# Patient Record
Sex: Female | Born: 1985 | Race: Black or African American | Hispanic: No | Marital: Single | Smoking: Current every day smoker
Health system: Southern US, Community
[De-identification: ages and names within clinical notes are randomized; demographics above are authoritative.]

## PROBLEM LIST (undated history)

## (undated) ENCOUNTER — Inpatient Hospital Stay (HOSPITAL_COMMUNITY): Payer: Self-pay

## (undated) DIAGNOSIS — R87619 Unspecified abnormal cytological findings in specimens from cervix uteri: Secondary | ICD-10-CM

## (undated) DIAGNOSIS — F329 Major depressive disorder, single episode, unspecified: Secondary | ICD-10-CM

## (undated) DIAGNOSIS — N390 Urinary tract infection, site not specified: Secondary | ICD-10-CM

## (undated) DIAGNOSIS — F32A Depression, unspecified: Secondary | ICD-10-CM

## (undated) DIAGNOSIS — F909 Attention-deficit hyperactivity disorder, unspecified type: Secondary | ICD-10-CM

## (undated) DIAGNOSIS — J45909 Unspecified asthma, uncomplicated: Secondary | ICD-10-CM

## (undated) DIAGNOSIS — B009 Herpesviral infection, unspecified: Secondary | ICD-10-CM

## (undated) DIAGNOSIS — F319 Bipolar disorder, unspecified: Secondary | ICD-10-CM

## (undated) DIAGNOSIS — F419 Anxiety disorder, unspecified: Secondary | ICD-10-CM

## (undated) DIAGNOSIS — R87629 Unspecified abnormal cytological findings in specimens from vagina: Secondary | ICD-10-CM

## (undated) DIAGNOSIS — IMO0002 Reserved for concepts with insufficient information to code with codable children: Secondary | ICD-10-CM

## (undated) HISTORY — PX: COLPOSCOPY: SHX161

## (undated) HISTORY — DX: Unspecified abnormal cytological findings in specimens from vagina: R87.629

## (undated) HISTORY — PX: INDUCED ABORTION: SHX677

---

## 2008-05-07 ENCOUNTER — Emergency Department (HOSPITAL_COMMUNITY): Admission: EM | Admit: 2008-05-07 | Discharge: 2008-05-07 | Payer: Self-pay | Admitting: Emergency Medicine

## 2008-12-31 ENCOUNTER — Inpatient Hospital Stay (HOSPITAL_COMMUNITY): Admission: AD | Admit: 2008-12-31 | Discharge: 2008-12-31 | Payer: Self-pay | Admitting: Obstetrics & Gynecology

## 2009-01-10 ENCOUNTER — Encounter: Payer: Self-pay | Admitting: Advanced Practice Midwife

## 2009-01-10 ENCOUNTER — Ambulatory Visit: Payer: Self-pay | Admitting: Obstetrics & Gynecology

## 2009-01-10 ENCOUNTER — Encounter: Payer: Self-pay | Admitting: Obstetrics & Gynecology

## 2009-01-10 LAB — CONVERTED CEMR LAB
Antibody Screen: NEGATIVE
Hgb F Quant: 0.2 % (ref 0.0–2.0)
Hgb S Quant: 0 % (ref 0.0–0.0)
Rubella: 98.4 intl units/mL — ABNORMAL HIGH

## 2009-01-24 ENCOUNTER — Ambulatory Visit: Payer: Self-pay | Admitting: Obstetrics & Gynecology

## 2009-01-31 ENCOUNTER — Ambulatory Visit: Payer: Self-pay | Admitting: Obstetrics & Gynecology

## 2009-01-31 ENCOUNTER — Encounter: Payer: Self-pay | Admitting: Obstetrics and Gynecology

## 2009-02-07 ENCOUNTER — Ambulatory Visit: Payer: Self-pay | Admitting: Obstetrics & Gynecology

## 2009-02-10 ENCOUNTER — Inpatient Hospital Stay (HOSPITAL_COMMUNITY): Admission: AD | Admit: 2009-02-10 | Discharge: 2009-02-10 | Payer: Self-pay | Admitting: Obstetrics & Gynecology

## 2009-02-14 ENCOUNTER — Ambulatory Visit: Payer: Self-pay | Admitting: Obstetrics & Gynecology

## 2009-02-15 ENCOUNTER — Ambulatory Visit: Payer: Self-pay | Admitting: Advanced Practice Midwife

## 2009-02-15 ENCOUNTER — Inpatient Hospital Stay (HOSPITAL_COMMUNITY): Admission: AD | Admit: 2009-02-15 | Discharge: 2009-02-18 | Payer: Self-pay | Admitting: Obstetrics & Gynecology

## 2009-02-15 ENCOUNTER — Encounter: Payer: Self-pay | Admitting: Obstetrics and Gynecology

## 2009-05-25 ENCOUNTER — Emergency Department (HOSPITAL_COMMUNITY): Admission: EM | Admit: 2009-05-25 | Discharge: 2009-05-25 | Payer: Self-pay | Admitting: Emergency Medicine

## 2009-06-05 ENCOUNTER — Emergency Department (HOSPITAL_COMMUNITY): Admission: EM | Admit: 2009-06-05 | Discharge: 2009-06-05 | Payer: Self-pay | Admitting: Emergency Medicine

## 2009-08-24 ENCOUNTER — Emergency Department (HOSPITAL_COMMUNITY): Admission: EM | Admit: 2009-08-24 | Discharge: 2009-08-24 | Payer: Self-pay | Admitting: Emergency Medicine

## 2009-12-06 ENCOUNTER — Emergency Department (HOSPITAL_COMMUNITY): Admission: EM | Admit: 2009-12-06 | Discharge: 2009-12-06 | Payer: Self-pay | Admitting: Emergency Medicine

## 2010-03-23 ENCOUNTER — Encounter: Payer: Self-pay | Admitting: Family Medicine

## 2010-03-23 ENCOUNTER — Inpatient Hospital Stay (HOSPITAL_COMMUNITY): Admission: AD | Admit: 2010-03-23 | Discharge: 2010-03-23 | Payer: Self-pay | Admitting: Family Medicine

## 2010-03-23 ENCOUNTER — Ambulatory Visit: Payer: Self-pay | Admitting: Advanced Practice Midwife

## 2010-06-10 ENCOUNTER — Inpatient Hospital Stay (HOSPITAL_COMMUNITY)
Admission: AD | Admit: 2010-06-10 | Discharge: 2010-06-10 | Payer: Self-pay | Source: Home / Self Care | Admitting: Obstetrics & Gynecology

## 2010-06-18 ENCOUNTER — Encounter: Payer: Self-pay | Admitting: Obstetrics and Gynecology

## 2010-06-18 ENCOUNTER — Ambulatory Visit: Payer: Self-pay | Admitting: Family Medicine

## 2010-06-19 ENCOUNTER — Encounter: Payer: Self-pay | Admitting: Obstetrics and Gynecology

## 2010-06-26 ENCOUNTER — Ambulatory Visit: Payer: Self-pay | Admitting: Obstetrics & Gynecology

## 2010-07-03 ENCOUNTER — Ambulatory Visit: Payer: Self-pay | Admitting: Obstetrics and Gynecology

## 2010-07-11 ENCOUNTER — Ambulatory Visit: Payer: Self-pay | Admitting: Obstetrics & Gynecology

## 2010-07-12 ENCOUNTER — Inpatient Hospital Stay (HOSPITAL_COMMUNITY)
Admission: AD | Admit: 2010-07-12 | Discharge: 2010-07-14 | Payer: Self-pay | Source: Home / Self Care | Attending: Obstetrics & Gynecology | Admitting: Obstetrics & Gynecology

## 2010-07-15 ENCOUNTER — Ambulatory Visit: Admit: 2010-07-15 | Payer: Self-pay | Admitting: Obstetrics and Gynecology

## 2010-07-17 ENCOUNTER — Ambulatory Visit: Admit: 2010-07-17 | Payer: Self-pay | Admitting: Obstetrics & Gynecology

## 2010-09-23 LAB — WET PREP, GENITAL
Trich, Wet Prep: NONE SEEN
Yeast Wet Prep HPF POC: NONE SEEN

## 2010-09-23 LAB — POCT URINALYSIS DIPSTICK
Bilirubin Urine: NEGATIVE
Glucose, UA: NEGATIVE mg/dL
Glucose, UA: NEGATIVE mg/dL
Ketones, ur: NEGATIVE mg/dL
Ketones, ur: NEGATIVE mg/dL
Nitrite: NEGATIVE
Protein, ur: NEGATIVE mg/dL
Specific Gravity, Urine: 1.015 (ref 1.005–1.030)
pH: 7 (ref 5.0–8.0)

## 2010-09-23 LAB — RAPID URINE DRUG SCREEN, HOSP PERFORMED
Amphetamines: NOT DETECTED
Barbiturates: NOT DETECTED
Tetrahydrocannabinol: POSITIVE — AB

## 2010-09-23 LAB — CBC
HCT: 31.1 % — ABNORMAL LOW (ref 36.0–46.0)
HCT: 32.8 % — ABNORMAL LOW (ref 36.0–46.0)
Hemoglobin: 10.7 g/dL — ABNORMAL LOW (ref 12.0–15.0)
MCHC: 34.5 g/dL (ref 30.0–36.0)
MCV: 85.2 fL (ref 78.0–100.0)
Platelets: 330 10*3/uL (ref 150–400)
RBC: 3.69 MIL/uL — ABNORMAL LOW (ref 3.87–5.11)
RDW: 13.4 % (ref 11.5–15.5)

## 2010-09-24 LAB — RAPID URINE DRUG SCREEN, HOSP PERFORMED
Amphetamines: NOT DETECTED
Barbiturates: NOT DETECTED
Benzodiazepines: NOT DETECTED
Cocaine: NOT DETECTED
Opiates: NOT DETECTED
Tetrahydrocannabinol: POSITIVE — AB

## 2010-09-24 LAB — CBC
HCT: 31.3 % — ABNORMAL LOW (ref 36.0–46.0)
Hemoglobin: 10.7 g/dL — ABNORMAL LOW (ref 12.0–15.0)
MCH: 31.1 pg (ref 26.0–34.0)
MCHC: 34.3 g/dL (ref 30.0–36.0)
MCV: 90.7 fL (ref 78.0–100.0)
Platelets: 298 10*3/uL (ref 150–400)
RBC: 3.45 MIL/uL — ABNORMAL LOW (ref 3.87–5.11)
RDW: 13.3 % (ref 11.5–15.5)
WBC: 9.6 10*3/uL (ref 4.0–10.5)

## 2010-09-24 LAB — DIFFERENTIAL
Basophils Absolute: 0.1 10*3/uL (ref 0.0–0.1)
Basophils Relative: 1 % (ref 0–1)
Eosinophils Absolute: 0.1 10*3/uL (ref 0.0–0.7)
Monocytes Relative: 6 % (ref 3–12)
Neutro Abs: 6 10*3/uL (ref 1.7–7.7)
Neutrophils Relative %: 63 % (ref 43–77)

## 2010-09-24 LAB — STREP B DNA PROBE: Strep Group B Ag: POSITIVE

## 2010-09-24 LAB — GC/CHLAMYDIA PROBE AMP, GENITAL
Chlamydia, DNA Probe: NEGATIVE
GC Probe Amp, Genital: NEGATIVE

## 2010-09-24 LAB — HEPATITIS B SURFACE ANTIGEN: Hepatitis B Surface Ag: NEGATIVE

## 2010-09-24 LAB — URINALYSIS, ROUTINE W REFLEX MICROSCOPIC
Bilirubin Urine: NEGATIVE
Hgb urine dipstick: NEGATIVE
Ketones, ur: NEGATIVE mg/dL
Protein, ur: NEGATIVE mg/dL
Urobilinogen, UA: 1 mg/dL (ref 0.0–1.0)

## 2010-09-24 LAB — HCG, QUANTITATIVE, PREGNANCY: hCG, Beta Chain, Quant, S: 30553 m[IU]/mL — ABNORMAL HIGH (ref <5)

## 2010-09-24 LAB — WET PREP, GENITAL: Trich, Wet Prep: NONE SEEN

## 2010-09-24 LAB — TYPE AND SCREEN: ABO/RH(D): A POS

## 2010-09-24 LAB — HIV ANTIBODY (ROUTINE TESTING W REFLEX): HIV: NONREACTIVE

## 2010-09-26 LAB — URINALYSIS, ROUTINE W REFLEX MICROSCOPIC
Glucose, UA: NEGATIVE mg/dL
Hgb urine dipstick: NEGATIVE
Protein, ur: NEGATIVE mg/dL
Specific Gravity, Urine: <1.005 — ABNORMAL LOW (ref 1.005–1.030)
pH: 6.5 (ref 5.0–8.0)

## 2010-09-30 LAB — URINALYSIS, ROUTINE W REFLEX MICROSCOPIC
Bilirubin Urine: NEGATIVE
Hgb urine dipstick: NEGATIVE
Specific Gravity, Urine: 1.03 (ref 1.005–1.030)
Urobilinogen, UA: 1 mg/dL (ref 0.0–1.0)

## 2010-09-30 LAB — WET PREP, GENITAL
Clue Cells Wet Prep HPF POC: NONE SEEN
Trich, Wet Prep: NONE SEEN
Yeast Wet Prep HPF POC: NONE SEEN

## 2010-09-30 LAB — URINE CULTURE: Culture: NO GROWTH

## 2010-09-30 LAB — URINE MICROSCOPIC-ADD ON

## 2010-10-16 LAB — URINALYSIS, ROUTINE W REFLEX MICROSCOPIC
Glucose, UA: NEGATIVE mg/dL
Hgb urine dipstick: NEGATIVE
pH: 6 (ref 5.0–8.0)

## 2010-10-16 LAB — URINE MICROSCOPIC-ADD ON

## 2010-10-16 LAB — GC/CHLAMYDIA PROBE AMP, GENITAL: Chlamydia, DNA Probe: POSITIVE — AB

## 2010-10-19 LAB — POCT URINALYSIS DIP (DEVICE)
Bilirubin Urine: NEGATIVE
Glucose, UA: NEGATIVE mg/dL
Ketones, ur: NEGATIVE mg/dL
Specific Gravity, Urine: 1.02 (ref 1.005–1.030)
Urobilinogen, UA: 0.2 mg/dL (ref 0.0–1.0)

## 2010-10-19 LAB — RPR: RPR Ser Ql: NONREACTIVE

## 2010-10-19 LAB — CBC
HCT: 32.8 % — ABNORMAL LOW (ref 36.0–46.0)
Hemoglobin: 11.3 g/dL — ABNORMAL LOW (ref 12.0–15.0)
MCV: 92.3 fL (ref 78.0–100.0)
RBC: 3.55 MIL/uL — ABNORMAL LOW (ref 3.87–5.11)
WBC: 12 10*3/uL — ABNORMAL HIGH (ref 4.0–10.5)

## 2010-10-20 LAB — POCT URINALYSIS DIP (DEVICE)
Bilirubin Urine: NEGATIVE
Glucose, UA: NEGATIVE mg/dL
Ketones, ur: NEGATIVE mg/dL
Ketones, ur: NEGATIVE mg/dL
Nitrite: NEGATIVE
Nitrite: NEGATIVE
Protein, ur: NEGATIVE mg/dL
Protein, ur: NEGATIVE mg/dL
Specific Gravity, Urine: 1.01 (ref 1.005–1.030)
Specific Gravity, Urine: 1.015 (ref 1.005–1.030)
Urobilinogen, UA: 0.2 mg/dL (ref 0.0–1.0)
Urobilinogen, UA: 0.2 mg/dL (ref 0.0–1.0)
pH: 6.5 (ref 5.0–8.0)
pH: 7 (ref 5.0–8.0)

## 2010-10-21 LAB — RAPID URINE DRUG SCREEN, HOSP PERFORMED: Tetrahydrocannabinol: POSITIVE — AB

## 2010-10-21 LAB — COMPREHENSIVE METABOLIC PANEL
ALT: 18 U/L (ref 0–35)
AST: 18 U/L (ref 0–37)
Albumin: 3 g/dL — ABNORMAL LOW (ref 3.5–5.2)
Alkaline Phosphatase: 73 U/L (ref 39–117)
CO2: 23 mEq/L (ref 19–32)
Chloride: 107 mEq/L (ref 96–112)
Creatinine, Ser: 0.49 mg/dL (ref 0.4–1.2)
GFR calc Af Amer: >60 mL/min (ref >60)
GFR calc non Af Amer: >60 mL/min (ref >60)
Potassium: 4 mEq/L (ref 3.5–5.1)
Sodium: 136 mEq/L (ref 135–145)
Total Bilirubin: 0.4 mg/dL (ref 0.3–1.2)

## 2010-10-21 LAB — URINALYSIS, ROUTINE W REFLEX MICROSCOPIC
Bilirubin Urine: NEGATIVE
Nitrite: NEGATIVE
Specific Gravity, Urine: 1.01 (ref 1.005–1.030)
pH: 7.5 (ref 5.0–8.0)

## 2010-10-21 LAB — GC/CHLAMYDIA PROBE AMP, URINE
Chlamydia, Swab/Urine, PCR: NEGATIVE
GC Probe Amp, Urine: NEGATIVE

## 2010-10-21 LAB — POCT URINALYSIS DIP (DEVICE)
Bilirubin Urine: NEGATIVE
Glucose, UA: NEGATIVE mg/dL
Nitrite: NEGATIVE
Urobilinogen, UA: 0.2 mg/dL (ref 0.0–1.0)

## 2010-10-21 LAB — CBC
Platelets: 254 10*3/uL (ref 150–400)
RBC: 3.57 MIL/uL — ABNORMAL LOW (ref 3.87–5.11)
WBC: 11.2 10*3/uL — ABNORMAL HIGH (ref 4.0–10.5)

## 2010-10-21 LAB — URINE MICROSCOPIC-ADD ON

## 2010-10-21 LAB — ABO/RH: ABO/RH(D): A POS

## 2011-01-07 ENCOUNTER — Emergency Department (HOSPITAL_COMMUNITY)
Admission: EM | Admit: 2011-01-07 | Discharge: 2011-01-07 | Discharge status: Against Medical Advice | Payer: Medicare Other | Attending: Emergency Medicine | Admitting: Emergency Medicine

## 2011-01-07 DIAGNOSIS — Z0389 Encounter for observation for other suspected diseases and conditions ruled out: Secondary | ICD-10-CM | POA: Insufficient documentation

## 2011-02-05 ENCOUNTER — Emergency Department (HOSPITAL_COMMUNITY)
Admission: EM | Admit: 2011-02-05 | Discharge: 2011-02-05 | Disposition: A | Discharge status: Home / Self Care | Payer: Medicare Other | Attending: Emergency Medicine | Admitting: Emergency Medicine

## 2011-02-05 DIAGNOSIS — N760 Acute vaginitis: Secondary | ICD-10-CM | POA: Insufficient documentation

## 2011-02-05 LAB — URINALYSIS, ROUTINE W REFLEX MICROSCOPIC
Bilirubin Urine: NEGATIVE
Glucose, UA: NEGATIVE mg/dL
Leukocytes, UA: NEGATIVE
Specific Gravity, Urine: 1.034 — ABNORMAL HIGH (ref 1.005–1.030)
pH: 6 (ref 5.0–8.0)

## 2011-02-05 LAB — POCT PREGNANCY, URINE: Preg Test, Ur: NEGATIVE

## 2011-02-05 LAB — WET PREP, GENITAL: Yeast Wet Prep HPF POC: NONE SEEN

## 2011-02-06 LAB — GC/CHLAMYDIA PROBE AMP, GENITAL: Chlamydia, DNA Probe: NEGATIVE

## 2011-05-26 ENCOUNTER — Emergency Department (HOSPITAL_COMMUNITY)
Admission: EM | Admit: 2011-05-26 | Discharge: 2011-05-27 | Disposition: A | Discharge status: Home / Self Care | Payer: Medicare Other | Attending: Emergency Medicine | Admitting: Emergency Medicine

## 2011-05-26 ENCOUNTER — Encounter: Payer: Self-pay | Admitting: *Deleted

## 2011-05-26 DIAGNOSIS — R109 Unspecified abdominal pain: Secondary | ICD-10-CM | POA: Insufficient documentation

## 2011-05-26 DIAGNOSIS — B009 Herpesviral infection, unspecified: Secondary | ICD-10-CM | POA: Insufficient documentation

## 2011-05-26 DIAGNOSIS — B001 Herpesviral vesicular dermatitis: Secondary | ICD-10-CM

## 2011-05-26 DIAGNOSIS — N898 Other specified noninflammatory disorders of vagina: Secondary | ICD-10-CM | POA: Insufficient documentation

## 2011-05-26 NOTE — ED Notes (Signed)
Pt in c/o vaginal discharge and pelvic pain x5 days

## 2011-05-27 ENCOUNTER — Emergency Department (HOSPITAL_COMMUNITY): Payer: Medicare Other

## 2011-05-27 LAB — CBC
HCT: 31.9 % — ABNORMAL LOW (ref 36.0–46.0)
MCH: 29.2 pg (ref 26.0–34.0)
MCV: 85.5 fL (ref 78.0–100.0)
Platelets: 324 10*3/uL (ref 150–400)
RBC: 3.73 MIL/uL — ABNORMAL LOW (ref 3.87–5.11)
WBC: 8.2 10*3/uL (ref 4.0–10.5)

## 2011-05-27 LAB — DIFFERENTIAL
Eosinophils Absolute: 0.1 10*3/uL (ref 0.0–0.7)
Eosinophils Relative: 2 % (ref 0–5)
Lymphocytes Relative: 54 % — ABNORMAL HIGH (ref 12–46)
Lymphs Abs: 4.4 10*3/uL — ABNORMAL HIGH (ref 0.7–4.0)
Monocytes Absolute: 0.4 10*3/uL (ref 0.1–1.0)
Monocytes Relative: 5 % (ref 3–12)

## 2011-05-27 LAB — WET PREP, GENITAL

## 2011-05-27 MED ORDER — CEFTRIAXONE SODIUM 250 MG IJ SOLR
250.0000 mg | Freq: Once | INTRAMUSCULAR | Status: AC
  Administered 2011-05-27: 250 mg via INTRAMUSCULAR
  Filled 2011-05-27: qty 250

## 2011-05-27 MED ORDER — ACYCLOVIR 5 % EX OINT: TOPICAL_OINTMENT | CUTANEOUS | Status: DC

## 2011-05-27 MED ORDER — AZITHROMYCIN 250 MG PO TABS
1000.0000 mg | ORAL_TABLET | Freq: Once | ORAL | Status: AC
  Administered 2011-05-27: 1000 mg via ORAL
  Filled 2011-05-27: qty 4

## 2011-05-27 NOTE — ED Provider Notes (Signed)
History     CSN: [id number] Arrival date & time: 05/26/2011  9:51 PM   First MD Initiated Contact with Patient 05/27/11 [phone]      Chief Complaint  Patient presents with  . Vaginal Discharge    (Consider location/radiation/quality/duration/timing/severity/associated sxs/prior treatment) HPI Comments: Ms. Motz reports having a therapeutic abortion November 3, started on birth control pills at that time and self-reports that she has missed 3-4 of these since starting, bled for 3-4 days,Developed dischargeabout 5 days ago.  Diffuse low abdominal pain, yellow vaginal discharge with an odor reports having unprotected intercourse  Patient is a 25 y.o. female presenting with vaginal discharge. The history is provided by the patient.  Vaginal Discharge This is a new problem. The current episode started in the past 7 days. The problem occurs constantly. The problem has been gradually worsening. Associated symptoms include abdominal pain. Pertinent negatives include no fever or nausea. Associated symptoms comments: Vaginal discharge.    History reviewed. No pertinent past medical history.  History reviewed. No pertinent past surgical history.  History reviewed. No pertinent family history.  History  Substance Use Topics  . Smoking status: Current Everyday Smoker  . Smokeless tobacco: Not on file  . Alcohol Use: No    OB History    Grav Para Term Preterm Abortions TAB SAB Ect Mult Living                  Review of Systems  Constitutional: Negative for fever.  HENT: Negative.   Eyes: Negative.   Respiratory: Negative.   Cardiovascular: Negative.   Gastrointestinal: Positive for abdominal pain. Negative for nausea.  Genitourinary: Positive for vaginal discharge, vaginal pain and pelvic pain. Negative for dysuria, frequency, vaginal bleeding, difficulty urinating and dyspareunia.  Musculoskeletal: Negative.   Neurological: Negative.   Hematological: Negative.     Psychiatric/Behavioral: Negative.     Allergies  Review of patient's allergies indicates no known allergies.  Home Medications   Current Outpatient Rx  Name Route Sig Dispense Refill  . METHYLPHENIDATE HCL 36 MG PO TBCR Oral Take 36 mg by mouth every morning.        BP 120/63  Pulse 67  Temp(Src) 99 F (37.2 C) (Oral)  Resp 20  SpO2 100%  LMP 03/30/2011  Physical Exam  Constitutional: She is oriented to person, place, and time. She appears well-developed and well-nourished.  HENT:  Head: Normocephalic.  Eyes: EOM are normal.  Neck: Neck supple.  Cardiovascular: Regular rhythm.   Pulmonary/Chest: Breath sounds normal.  Abdominal: Bowel sounds are normal. She exhibits no distension. There is tenderness. There is rebound.  Genitourinary: Uterus normal. Right adnexum displays tenderness. Left adnexum displays tenderness. There is tenderness around the vagina. Vaginal discharge found.  Musculoskeletal: Normal range of motion.  Neurological: She is oriented to person, place, and time.  Skin: Skin is warm.  Psychiatric: She has a normal mood and affect.    ED Course  Procedures (including critical care time)   Labs Reviewed  POCT PREGNANCY, URINE  GC/CHLAMYDIA PROBE AMP, GENITAL  WET PREP, GENITAL  POCT PREGNANCY, URINE  CBC  DIFFERENTIAL   No results found.   No diagnosis found.    MDM  Retained products of conception         Arman Filter, NP 05/27/11 0428  Arman Filter, NP 05/27/11 [phone]

## 2011-05-27 NOTE — ED Notes (Signed)
Patient remains in ultrasound.

## 2011-05-27 NOTE — ED Provider Notes (Signed)
Medical screening examination/treatment/procedure(s) were performed by non-physician practitioner and as supervising physician I was immediately available for consultation/collaboration. Devoria Albe, MD, Armando Gang   Ward Givens, MD 05/27/11 [phone]

## 2011-05-27 NOTE — ED Notes (Signed)
Spoke with Raiford Noble in Radiology. States ultrasound tech aware of order for transvaginal US OB.

## 2011-05-27 NOTE — ED Notes (Signed)
Patient states that she is concerned with her lip breaking out. States that she has had brownish, malodorous vaginal discharge for a month. Denies dysuria. Patient states "I think I have PID." States she has a history of STDs but "not any of the ones you can't get rid of."

## 2011-05-27 NOTE — ED Notes (Signed)
Transported to ultrasound

## 2011-05-27 NOTE — ED Notes (Signed)
Returned from ultrasound.

## 2011-05-28 LAB — GC/CHLAMYDIA PROBE AMP, GENITAL
Chlamydia, DNA Probe: NEGATIVE
GC Probe Amp, Genital: NEGATIVE

## 2011-08-05 ENCOUNTER — Ambulatory Visit (INDEPENDENT_AMBULATORY_CARE_PROVIDER_SITE_OTHER): Payer: Medicare Other

## 2011-08-05 DIAGNOSIS — F909 Attention-deficit hyperactivity disorder, unspecified type: Secondary | ICD-10-CM

## 2011-08-05 DIAGNOSIS — Z309 Encounter for contraceptive management, unspecified: Secondary | ICD-10-CM

## 2011-08-05 DIAGNOSIS — F319 Bipolar disorder, unspecified: Secondary | ICD-10-CM

## 2011-08-05 DIAGNOSIS — Z01419 Encounter for gynecological examination (general) (routine) without abnormal findings: Secondary | ICD-10-CM

## 2011-08-09 ENCOUNTER — Ambulatory Visit (INDEPENDENT_AMBULATORY_CARE_PROVIDER_SITE_OTHER): Payer: Medicare Other

## 2011-08-09 DIAGNOSIS — Z719 Counseling, unspecified: Secondary | ICD-10-CM

## 2011-08-16 ENCOUNTER — Encounter (HOSPITAL_COMMUNITY): Payer: Self-pay | Admitting: *Deleted

## 2011-08-16 ENCOUNTER — Inpatient Hospital Stay (HOSPITAL_COMMUNITY)
Admission: AD | Admit: 2011-08-16 | Discharge: 2011-08-16 | Disposition: A | Discharge status: Home / Self Care | Payer: Medicare Other | Source: Ambulatory Visit | Attending: Family Medicine | Admitting: Family Medicine

## 2011-08-16 DIAGNOSIS — Z8619 Personal history of other infectious and parasitic diseases: Secondary | ICD-10-CM

## 2011-08-16 DIAGNOSIS — N760 Acute vaginitis: Secondary | ICD-10-CM | POA: Insufficient documentation

## 2011-08-16 DIAGNOSIS — N949 Unspecified condition associated with female genital organs and menstrual cycle: Secondary | ICD-10-CM | POA: Insufficient documentation

## 2011-08-16 HISTORY — DX: Major depressive disorder, single episode, unspecified: F32.9

## 2011-08-16 HISTORY — DX: Urinary tract infection, site not specified: N39.0

## 2011-08-16 HISTORY — DX: Unspecified abnormal cytological findings in specimens from cervix uteri: R87.619

## 2011-08-16 HISTORY — DX: Herpesviral infection, unspecified: B00.9

## 2011-08-16 HISTORY — DX: Reserved for concepts with insufficient information to code with codable children: IMO0002

## 2011-08-16 HISTORY — DX: Depression, unspecified: F32.A

## 2011-08-16 LAB — POCT PREGNANCY, URINE: Preg Test, Ur: NEGATIVE

## 2011-08-16 LAB — URINALYSIS, MICROSCOPIC ONLY
Bilirubin Urine: NEGATIVE
Hgb urine dipstick: NEGATIVE
Ketones, ur: NEGATIVE mg/dL
Specific Gravity, Urine: 1.025 (ref 1.005–1.030)
pH: 6 (ref 5.0–8.0)

## 2011-08-16 LAB — WET PREP, GENITAL
Trich, Wet Prep: NONE SEEN
Yeast Wet Prep HPF POC: NONE SEEN

## 2011-08-16 MED ORDER — TRIAMCINOLONE ACETONIDE 0.1 % EX OINT: TOPICAL_OINTMENT | Freq: Two times a day (BID) | CUTANEOUS | Status: DC

## 2011-08-16 MED ORDER — VALACYCLOVIR HCL 500 MG PO TABS: 500.0000 mg | ORAL_TABLET | Freq: Two times a day (BID) | ORAL | Status: DC

## 2011-08-16 NOTE — Progress Notes (Signed)
Pt presents to [facility] with chief complaints of "herpes outbreak". 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

## 2011-08-16 NOTE — ED Provider Notes (Signed)
History   Karen Lester is a 26 y.o. year old [id number] female who presents to [facility] reporting vaginal irritation and possible HSV II outbreak. She states she tested positive for it at [facility] last month during an STD check, but has never had an outbreak. She denies rash or vesicles. She states she also Dx w/ Trichomonas an Tx, but vomited about an hour after taking medication. She is also very anxious about a recent abn Pap for which she has a F/U appointment scheduled 08/19/11 w/ her Gyn in [facility].   CSN: [id number]  Arrival date & time 08/16/11  4098   None     Chief Complaint  Patient presents with  . Herpes Zoster    (Consider location/radiation/quality/duration/timing/severity/associated sxs/prior treatment) HPI  Past Medical History  Diagnosis Date  . Herpes   . Depression   . Urinary tract infection   . Abnormal Pap smear     Has appt. 2/5 to treat abnormal PAP    Past Surgical History  Procedure Date  . Induced abortion     History reviewed. No pertinent family history.  History  Substance Use Topics  . Smoking status: Current Everyday Smoker -- 0.2 packs/day  . Smokeless tobacco: Never Used  . Alcohol Use: Yes     OCcas.    OB History    Grav Para Term Preterm Abortions TAB SAB Ect Mult Living   5 4 4  1 1    4       Review of Systems: Otherwise negative  Allergies  Review of patient's allergies indicates no known allergies.  Home Medications  No current outpatient prescriptions on file.  BP 135/91  Pulse 100  Temp(Src) 98.9 F (37.2 C) (Oral)  Resp 22  Wt 86.183 kg (190 lb)  LMP 07/17/2011  Physical Exam Pt highly anxious, tearful, A&Ox4, pressured speech Pelvic: No lesions or erythema. Small amount of thin, white, odorless discharge. No CMT  ED Course  Procedures (including critical care time)  Results for orders placed during the hospital encounter of 08/16/11 (from the past 24 hour(s))  URINALYSIS, WITH MICROSCOPIC      Status: Abnormal   Collection Time   08/16/11  9:50 AM      Component Value Range   Color, Urine YELLOW  YELLOW    APPearance CLEAR  CLEAR    Specific Gravity, Urine 1.025  1.005 - 1.030    pH 6.0  5.0 - 8.0    Glucose, UA NEGATIVE  NEGATIVE (mg/dL)   Hgb urine dipstick NEGATIVE  NEGATIVE    Bilirubin Urine NEGATIVE  NEGATIVE    Ketones, ur NEGATIVE  NEGATIVE (mg/dL)   Protein, ur NEGATIVE  NEGATIVE (mg/dL)   Urobilinogen, UA 0.2  0.0 - 1.0 (mg/dL)   Nitrite NEGATIVE  NEGATIVE    Leukocytes, UA NEGATIVE  NEGATIVE    WBC, UA 0-2  <3 (WBC/hpf)   RBC / HPF 0-2  <3 (RBC/hpf)   Bacteria, UA FEW (*) RARE    Squamous Epithelial / LPF FEW (*) RARE   POCT PREGNANCY, URINE     Status: Normal   Collection Time   08/16/11  9:55 AM      Component Value Range   Preg Test, Ur NEGATIVE  NEGATIVE   WET PREP, GENITAL     Status: Abnormal   Collection Time   08/16/11 10:41 AM      Component Value Range   Yeast Wet Prep HPF POC NONE SEEN  NONE  SEEN    Trich, Wet Prep NONE SEEN  NONE SEEN    Clue Cells Wet Prep HPF POC NONE SEEN  NONE SEEN    WBC, Wet Prep HPF POC FEW (*) NONE SEEN    1. Vaginitis   2. 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 D/C home 2. Rx Valtrex  3. Lengthy discussion about HSV II transmission, outbreaks, Sx, prevention. Pt very apprehensive about taking Valtrex. Afraid it will cause outbreak. Reassured that it will not, but that it can help decrease the duration and severity of an outbreak. Pt had many questions about abn Pap. Referred her to her Gyn to discuss specific findings. Also encouraged her to F/U w/ her Gynecologist for any related issues instead of having testing done at various locations where the providers don't know what tests have been done, etc. 4. Encouraged condom use, but emphasized that they cannot prevent all transmission.  Dorathy Kinsman 08/16/2011

## 2011-08-16 NOTE — ED Notes (Signed)
Questioned patient regarding visit in November where she was treated for HSV out break and had + UPT. States she does not remember being told she had an HSV outbreak and does not remember receiving any kind of treatment. States she had had an abortion. Patient asking many questions about previous visit and informed midlevel can review and discuss with her as well as address her concerns for today.

## 2011-08-16 NOTE — ED Notes (Signed)
Patient talking on cell phone, motioning for RN to be quiet and wait.

## 2011-08-20 NOTE — ED Provider Notes (Signed)
Chart reviewed and agree with management and plan.  

## 2011-10-09 ENCOUNTER — Other Ambulatory Visit: Payer: Self-pay | Admitting: Obstetrics and Gynecology

## 2011-11-12 ENCOUNTER — Encounter (HOSPITAL_COMMUNITY): Payer: Self-pay | Admitting: Emergency Medicine

## 2011-11-12 ENCOUNTER — Emergency Department (HOSPITAL_COMMUNITY)
Admission: EM | Admit: 2011-11-12 | Discharge: 2011-11-13 | Disposition: A | Discharge status: Home / Self Care | Payer: No Typology Code available for payment source | Attending: Emergency Medicine | Admitting: Emergency Medicine

## 2011-11-12 DIAGNOSIS — M545 Low back pain, unspecified: Secondary | ICD-10-CM | POA: Insufficient documentation

## 2011-11-12 DIAGNOSIS — F329 Major depressive disorder, single episode, unspecified: Secondary | ICD-10-CM | POA: Insufficient documentation

## 2011-11-12 DIAGNOSIS — F3289 Other specified depressive episodes: Secondary | ICD-10-CM | POA: Insufficient documentation

## 2011-11-12 DIAGNOSIS — M542 Cervicalgia: Secondary | ICD-10-CM | POA: Insufficient documentation

## 2011-11-12 DIAGNOSIS — M546 Pain in thoracic spine: Secondary | ICD-10-CM | POA: Insufficient documentation

## 2011-11-12 DIAGNOSIS — M79609 Pain in unspecified limb: Secondary | ICD-10-CM | POA: Insufficient documentation

## 2011-11-12 MED ORDER — DIAZEPAM 5 MG PO TABS: 5.0000 mg | ORAL_TABLET | Freq: Two times a day (BID) | ORAL | Status: AC

## 2011-11-12 MED ORDER — OXYCODONE-ACETAMINOPHEN 5-325 MG PO TABS
2.0000 | ORAL_TABLET | Freq: Once | ORAL | Status: AC
  Administered 2011-11-12: 2 via ORAL
  Filled 2011-11-12: qty 2

## 2011-11-12 MED ORDER — DIAZEPAM 5 MG/ML IJ SOLN
5.0000 mg | Freq: Once | INTRAMUSCULAR | Status: AC
  Administered 2011-11-12: 5 mg via INTRAMUSCULAR
  Filled 2011-11-12: qty 2
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
## 2011-11-13 NOTE — ED Provider Notes (Signed)
Medical screening examination/treatment/procedure(s) were performed by non-physician practitioner and as supervising physician I was immediately available for consultation/collaboration.  Arlow Spiers L Caydon Feasel, MD 11/13/11 0748 

## 2011-11-26 ENCOUNTER — Emergency Department (HOSPITAL_COMMUNITY)
Admission: EM | Admit: 2011-11-26 | Discharge: 2011-11-26 | Disposition: A | Discharge status: Home / Self Care | Payer: Medicare Other | Attending: Emergency Medicine | Admitting: Emergency Medicine

## 2011-11-26 ENCOUNTER — Emergency Department (HOSPITAL_COMMUNITY): Payer: Medicare Other

## 2011-11-26 ENCOUNTER — Encounter (HOSPITAL_COMMUNITY): Payer: Self-pay | Admitting: Emergency Medicine

## 2011-11-26 DIAGNOSIS — M545 Low back pain, unspecified: Secondary | ICD-10-CM | POA: Insufficient documentation

## 2011-11-26 DIAGNOSIS — M25569 Pain in unspecified knee: Secondary | ICD-10-CM | POA: Insufficient documentation

## 2011-11-26 DIAGNOSIS — F909 Attention-deficit hyperactivity disorder, unspecified type: Secondary | ICD-10-CM | POA: Insufficient documentation
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
## 2011-11-26 MED ORDER — IBUPROFEN 800 MG PO TABS: 800.0000 mg | ORAL_TABLET | Freq: Three times a day (TID) | ORAL | Status: AC

## 2011-11-26 MED ORDER — IBUPROFEN 800 MG PO TABS
800.0000 mg | ORAL_TABLET | Freq: Once | ORAL | Status: AC
  Administered 2011-11-26: 800 mg via ORAL
  Filled 2011-11-26: qty 1
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
## 2012-02-23 ENCOUNTER — Emergency Department (HOSPITAL_COMMUNITY)
Admission: EM | Admit: 2012-02-23 | Discharge: 2012-02-25 | Disposition: A | Discharge status: Home / Self Care | Payer: Medicare Other | Attending: Emergency Medicine | Admitting: Emergency Medicine

## 2012-02-23 DIAGNOSIS — F319 Bipolar disorder, unspecified: Secondary | ICD-10-CM

## 2012-02-23 DIAGNOSIS — F1910 Other psychoactive substance abuse, uncomplicated: Secondary | ICD-10-CM

## 2012-02-23 DIAGNOSIS — Z046 Encounter for general psychiatric examination, requested by authority: Secondary | ICD-10-CM | POA: Insufficient documentation

## 2012-02-23 DIAGNOSIS — F909 Attention-deficit hyperactivity disorder, unspecified type: Secondary | ICD-10-CM

## 2012-02-23 HISTORY — DX: Attention-deficit hyperactivity disorder, unspecified type: F90.9

## 2012-02-23 HISTORY — DX: Bipolar disorder, unspecified: F31.9

## 2012-02-23 HISTORY — DX: Anxiety disorder, unspecified: F41.9

## 2012-02-24 ENCOUNTER — Encounter (HOSPITAL_COMMUNITY): Payer: Self-pay | Admitting: *Deleted

## 2012-02-24 DIAGNOSIS — F319 Bipolar disorder, unspecified: Secondary | ICD-10-CM

## 2012-02-24 DIAGNOSIS — F909 Attention-deficit hyperactivity disorder, unspecified type: Secondary | ICD-10-CM

## 2012-02-24 DIAGNOSIS — F1910 Other psychoactive substance abuse, uncomplicated: Secondary | ICD-10-CM

## 2012-02-24 LAB — URINE MICROSCOPIC-ADD ON

## 2012-02-24 LAB — RAPID URINE DRUG SCREEN, HOSP PERFORMED
Benzodiazepines: NOT DETECTED
Opiates: NOT DETECTED

## 2012-02-24 LAB — COMPREHENSIVE METABOLIC PANEL
Alkaline Phosphatase: 57 U/L (ref 39–117)
BUN: 10 mg/dL (ref 6–23)
Calcium: 9.4 mg/dL (ref 8.4–10.5)
Creatinine, Ser: 0.87 mg/dL (ref 0.50–1.10)
GFR calc Af Amer: >90 mL/min (ref >90)
Glucose, Bld: 86 mg/dL (ref 70–99)
Potassium: 3.3 mEq/L — ABNORMAL LOW (ref 3.5–5.1)
Total Protein: 8 g/dL (ref 6.0–8.3)

## 2012-02-24 LAB — URINALYSIS, ROUTINE W REFLEX MICROSCOPIC
Glucose, UA: NEGATIVE mg/dL
Hgb urine dipstick: NEGATIVE
Nitrite: NEGATIVE
Specific Gravity, Urine: 1.037 — ABNORMAL HIGH (ref 1.005–1.030)
pH: 6 (ref 5.0–8.0)

## 2012-02-24 LAB — CBC
HCT: 37.9 % (ref 36.0–46.0)
Hemoglobin: 13.1 g/dL (ref 12.0–15.0)
MCH: 28.9 pg (ref 26.0–34.0)
MCHC: 34.6 g/dL (ref 30.0–36.0)
MCV: 83.7 fL (ref 78.0–100.0)
RDW: 12.7 % (ref 11.5–15.5)

## 2012-02-24 LAB — PREGNANCY, URINE: Preg Test, Ur: NEGATIVE

## 2012-02-24 LAB — ETHANOL: Alcohol, Ethyl (B): <11 mg/dL (ref 0–11)

## 2012-02-24 MED ORDER — LORAZEPAM 1 MG PO TABS: 1.0000 mg | ORAL_TABLET | Freq: Three times a day (TID) | ORAL | Status: DC | PRN

## 2012-02-24 MED ORDER — ONDANSETRON HCL 4 MG PO TABS: 4.0000 mg | ORAL_TABLET | Freq: Three times a day (TID) | ORAL | Status: DC | PRN

## 2012-02-24 MED ORDER — LORAZEPAM 1 MG PO TABS: 2.0000 mg | ORAL_TABLET | Freq: Four times a day (QID) | ORAL | Status: DC | PRN

## 2012-02-24 MED ORDER — NICOTINE 21 MG/24HR TD PT24: 21.0000 mg | MEDICATED_PATCH | Freq: Every day | TRANSDERMAL | Status: DC

## 2012-02-24 MED ORDER — IBUPROFEN 200 MG PO TABS: 600.0000 mg | ORAL_TABLET | Freq: Three times a day (TID) | ORAL | Status: DC | PRN

## 2012-02-24 MED ORDER — ALUM & MAG HYDROXIDE-SIMETH 200-200-20 MG/5ML PO SUSP: 30.0000 mL | ORAL | Status: DC | PRN

## 2012-02-24 MED ORDER — ZIPRASIDONE HCL 20 MG PO CAPS
20.0000 mg | ORAL_CAPSULE | Freq: Two times a day (BID) | ORAL | Status: DC
  Administered 2012-02-24 – 2012-02-25 (×3): 20 mg via ORAL
  Filled 2012-02-24 (×3): qty 1

## 2012-02-24 MED ORDER — HALOPERIDOL LACTATE 5 MG/ML IJ SOLN: 5.0000 mg | Freq: Once | INTRAMUSCULAR | Status: DC

## 2012-02-24 MED ORDER — ZIPRASIDONE MESYLATE 20 MG IM SOLR
20.0000 mg | Freq: Once | INTRAMUSCULAR | Status: AC
  Administered 2012-02-24: 20 mg via INTRAMUSCULAR

## 2012-02-24 MED ORDER — ACETAMINOPHEN 325 MG PO TABS: 650.0000 mg | ORAL_TABLET | ORAL | Status: DC | PRN

## 2012-02-24 MED ORDER — ZIPRASIDONE MESYLATE 20 MG IM SOLR
10.0000 mg | Freq: Once | INTRAMUSCULAR | Status: DC
  Filled 2012-02-24: qty 20
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
## 2012-02-24 NOTE — ED Notes (Addendum)
telepsych complete and recommends in patient treatment.  Pt aware and is angry yelling/cursing/reporting she is not going to take any medications, and is not going to stay.  Pt is aware that she is under IVC and can not leave, and reports that "we can't make her stay.Marland KitchenMarland KitchenI'm goin to leave." EDP aware
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
## 2012-02-24 NOTE — ED Notes (Addendum)
Pt yelling "I can't breath"..sitting on the side of the bed-hyperventilateing at times, nose stopped up.  No resp distress noted.   Reasurred, emotional support given. Pt was able to slow breathing down w/o difficulty.  O2 sats 98-100% RA.  Pt reports she does not like the way the medication makes her feel and she does not want to go to sleep.  VSS. Up to the bathroom, ambulatory w/o difficulty
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
## 2012-02-24 NOTE — ED Notes (Signed)
telepsych in progress 

## 2012-02-24 NOTE — ED Provider Notes (Signed)
Pt alert, content, vitals normal, nad. Awaiting act eval/placement.  Psychiatrist to round on pt later today.   Suzi Roots, MD 02/24/12 1043
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
## 2012-02-24 NOTE — ED Notes (Signed)
Up to the bathroom 

## 2012-02-24 NOTE — ED Notes (Signed)
Up to the desk, reports she is feeling better questions about admission. NAD.
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
## 2012-02-24 NOTE — H&P (Signed)
Medical screening examination/treatment/procedure(s) were performed by non-physician practitioner and as supervising physician I was immediately available for consultation/collaboration.  Pt here under IVC, to have telepsych completed.

## 2012-02-24 NOTE — ED Notes (Signed)
Security here, pt in room cursing/talking loudly/shoving table and chair.  Both removed,  Pt then layed down on the bed.

## 2012-02-24 NOTE — ED Notes (Signed)
Calm, resting quietly, reasurred, juice/crackers given

## 2012-02-25 DIAGNOSIS — F909 Attention-deficit hyperactivity disorder, unspecified type: Secondary | ICD-10-CM

## 2012-02-25 DIAGNOSIS — F1994 Other psychoactive substance use, unspecified with psychoactive substance-induced mood disorder: Secondary | ICD-10-CM

## 2012-02-25 DIAGNOSIS — F1410 Cocaine abuse, uncomplicated: Secondary | ICD-10-CM

## 2012-02-25 DIAGNOSIS — F1210 Cannabis abuse, uncomplicated: Secondary | ICD-10-CM

## 2012-02-25 NOTE — ED Provider Notes (Signed)
Pt has been evaluated by psych discharge home has been recommended.  Bedside evaluation performed - pt denies SI/HI.  No other issues or concerns expressed by Ms. Konecny.  Plan d/c home per psych recommendations.  Tobin Chad, MD 02/25/12 [phone]
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
## 2012-02-25 NOTE — ED Provider Notes (Signed)
  Physical Exam  BP 103/62  Pulse 74  Temp 98.4 F (36.9 C) (Oral)  Resp 18  Ht 5\' 5"  (1.651 m)  SpO2 98%  LMP 01/24/2012  Physical Exam  ED Course  Procedures  MDM Patient is sleeping comfortably. telemetry psych has recommended inpatient treatment.      Juliet Rude. Rubin Payor, MD 02/25/12 [phone]

## 2012-02-25 NOTE — BHH Counselor (Signed)
TC from [facility] @ [place]. Pt was declined for services there by Dr. Wendall Stade due to pt acuity.

## 2012-02-25 NOTE — ED Notes (Signed)
Advised Dr Lorenso Courier that pt was ready to be discharged.  He advised "ok, I am busy right now but will get to it".

## 2012-05-06 ENCOUNTER — Emergency Department (HOSPITAL_COMMUNITY)
Admission: EM | Admit: 2012-05-06 | Discharge: 2012-05-06 | Disposition: A | Discharge status: Home / Self Care | Payer: Medicare Other | Attending: Emergency Medicine | Admitting: Emergency Medicine

## 2012-05-06 ENCOUNTER — Encounter (HOSPITAL_COMMUNITY): Payer: Self-pay

## 2012-05-06 DIAGNOSIS — N390 Urinary tract infection, site not specified: Secondary | ICD-10-CM | POA: Insufficient documentation

## 2012-05-06 DIAGNOSIS — F309 Manic episode, unspecified: Secondary | ICD-10-CM | POA: Insufficient documentation

## 2012-05-06 DIAGNOSIS — N760 Acute vaginitis: Secondary | ICD-10-CM | POA: Insufficient documentation

## 2012-05-06 DIAGNOSIS — N898 Other specified noninflammatory disorders of vagina: Secondary | ICD-10-CM | POA: Insufficient documentation

## 2012-05-06 DIAGNOSIS — B9689 Other specified bacterial agents as the cause of diseases classified elsewhere: Secondary | ICD-10-CM

## 2012-05-06 DIAGNOSIS — M545 Low back pain, unspecified: Secondary | ICD-10-CM | POA: Insufficient documentation

## 2012-05-06 DIAGNOSIS — Z79899 Other long term (current) drug therapy: Secondary | ICD-10-CM | POA: Insufficient documentation

## 2012-05-06 DIAGNOSIS — F17200 Nicotine dependence, unspecified, uncomplicated: Secondary | ICD-10-CM | POA: Insufficient documentation

## 2012-05-06 DIAGNOSIS — F909 Attention-deficit hyperactivity disorder, unspecified type: Secondary | ICD-10-CM | POA: Insufficient documentation

## 2012-05-06 DIAGNOSIS — Z8619 Personal history of other infectious and parasitic diseases: Secondary | ICD-10-CM | POA: Insufficient documentation

## 2012-05-06 DIAGNOSIS — F411 Generalized anxiety disorder: Secondary | ICD-10-CM | POA: Insufficient documentation

## 2012-05-06 LAB — URINALYSIS, ROUTINE W REFLEX MICROSCOPIC
Glucose, UA: NEGATIVE mg/dL
Ketones, ur: NEGATIVE mg/dL
Protein, ur: NEGATIVE mg/dL

## 2012-05-06 LAB — WET PREP, GENITAL

## 2012-05-06 LAB — URINE MICROSCOPIC-ADD ON

## 2012-05-06 MED ORDER — PHENAZOPYRIDINE HCL 200 MG PO TABS: 200.0000 mg | ORAL_TABLET | Freq: Three times a day (TID) | ORAL | Status: DC

## 2012-05-06 MED ORDER — SULFAMETHOXAZOLE-TRIMETHOPRIM 800-160 MG PO TABS: 1.0000 | ORAL_TABLET | Freq: Two times a day (BID) | ORAL | Status: AC

## 2012-05-06 MED ORDER — METRONIDAZOLE 500 MG PO TABS: 500.0000 mg | ORAL_TABLET | Freq: Two times a day (BID) | ORAL | Status: DC

## 2012-05-06 NOTE — ED Provider Notes (Signed)
History     CSN: [id number]  Arrival date & time 05/06/12  2956   First MD Initiated Contact with Patient 05/06/12 [phone]      Chief Complaint  Patient presents with  . Back Pain  . Urinary Frequency  . Vaginal Discharge    (Consider location/radiation/quality/duration/timing/severity/associated sxs/prior treatment) HPI Comments: Patient is a 26 year old otherwise healthy G61P4 female who presents with a 1 month history of back pain. The pain is located in her mid back bilaterally and radiates down to her lower back bilaterally. The pain started gradually without known cause and progressively worsened. The pain is intermittent and described as sharp and aching. No known pattern to onset of pain. No aggravating/alleviating factors. She has not tried anything for pain relief. Patient reports associated urinary frequency, vaginal discharge, and foul odor. Her LMP was 05/05/2012. Patient denies fever, chills, headache, NVD, chest pain, SOB.   Patient is a 26 y.o. female presenting with back pain, frequency, and vaginal discharge.  Back Pain   Urinary Frequency  Vaginal Discharge    Past Medical History  Diagnosis Date  . Herpes   . Depression   . Urinary tract infection   . Abnormal Pap smear     Has appt. 2/5 to treat abnormal PAP  . ADHD (attention deficit hyperactivity disorder)   . Bipolar 1 disorder   . Anxiety     Past Surgical History  Procedure Date  . Induced abortion     History reviewed. No pertinent family history.  History  Substance Use Topics  . Smoking status: Current Every Day Smoker -- 0.2 packs/day    Types: Cigarettes  . Smokeless tobacco: Never Used  . Alcohol Use: 1.8 oz/week    3 Shots of liquor per week     OCcas.    OB History    Grav Para Term Preterm Abortions TAB SAB Ect Mult Living   5 4 4  1 1    4       Review of Systems  Genitourinary: Positive for frequency and vaginal discharge.  Musculoskeletal: Positive for back pain.  All  other systems reviewed and are negative.    Allergies  Review of patient's allergies indicates no known allergies.  Home Medications   Current Outpatient Rx  Name Route Sig Dispense Refill  . ALPRAZOLAM 0.5 MG PO TABS Oral Take 0.5 mg by mouth at bedtime as needed.    Marland Kitchen CITALOPRAM HYDROBROMIDE 20 MG PO TABS Oral Take 20 mg by mouth daily.    . METHYLPHENIDATE HCL ER 36 MG PO TBCR Oral Take 36 mg by mouth every morning.    Marland Kitchen VALACYCLOVIR HCL 500 MG PO TABS Oral Take 500 mg by mouth 2 (two) times daily. Pt to take for 10 days. Started on 05-03-12    . TRIAMCINOLONE ACETONIDE 0.1 % EX CREA Topical Apply 1 application topically 2 (two) times daily as needed. For irritated skin      BP 113/99  Pulse 77  Temp 98.3 F (36.8 C) (Oral)  Resp 18  SpO2 99%  LMP 05/06/2012  Physical Exam  Nursing note and vitals reviewed. Constitutional: She is oriented to person, place, and time. She appears well-developed and well-nourished. No distress.  HENT:  Head: Normocephalic and atraumatic.  Eyes: Conjunctivae normal and EOM are normal. Pupils are equal, round, and reactive to light. No scleral icterus.  Neck: Normal range of motion. Neck supple.  Cardiovascular: Normal rate and regular rhythm.  Exam reveals no gallop  and no friction rub.   No murmur heard. Pulmonary/Chest: Effort normal and breath sounds normal. She has no wheezes. She has no rales. She exhibits no tenderness.  Abdominal: Soft. There is tenderness.       Suprapubic tenderness to palpation.   Musculoskeletal: Normal range of motion.  Neurological: She is alert and oriented to person, place, and time.       Speech is goal-oriented. Moves limbs without ataxia.   Skin: Skin is warm and dry. She is not diaphoretic.  Psychiatric: She has a normal mood and affect. Her behavior is normal.    ED Course  Procedures (including critical care time)  Labs Reviewed  URINALYSIS, ROUTINE W REFLEX MICROSCOPIC - Abnormal; Notable for the  following:    APPearance CLOUDY (*)     Specific Gravity, Urine 1.031 (*)     Hgb urine dipstick MODERATE (*)     Leukocytes, UA TRACE (*)     All other components within normal limits  WET PREP, GENITAL - Abnormal; Notable for the following:    Clue Cells Wet Prep HPF POC FEW (*)     WBC, Wet Prep HPF POC FEW (*)     All other components within normal limits  URINE MICROSCOPIC-ADD ON - Abnormal; Notable for the following:    Bacteria, UA FEW (*)     All other components within normal limits  POCT PREGNANCY, URINE  GC/CHLAMYDIA PROBE AMP, GENITAL  URINE CULTURE   No results found.   1. UTI (urinary tract infection)   2. BV (bacterial vaginosis)       MDM  10:03 AM Urinalysis pending. I will do a pelvic exam. Patient denies current pain.   10:22 AM Wet prep and GC/Chlamydia sent to lab. Pelvic done. Patient denies needed for tylenol/pain relief currently.   11:27 AM Patient's wet prep shows BV and catheterized urine likely indicates UTI. I will treat her for both given her suprapubic tenderness and vaginal odor. No further evaluation needed at this time. GC/Chlamydia pending, and patient will be contacted within 48 hours with positive results.     Emilia Beck, PA-C 05/06/12 [phone]

## 2012-05-06 NOTE — ED Provider Notes (Signed)
Medical screening examination/treatment/procedure(s) were performed by non-physician practitioner and as supervising physician I was immediately available for consultation/collaboration.   Rolan Bucco, MD 05/06/12 1550

## 2012-05-06 NOTE — ED Notes (Signed)
Patient c/o low back pain, urinary frequency and vaginal discharge with foul odor x 3 weeks. Patient denies fever or chills.

## 2012-05-06 NOTE — ED Notes (Addendum)
Pt sts increasing lower back pain and vaginal discharge, since September.  Sts vaginal discharge is beginning to have an odor.  Sts felt a "lump" in her R breast yesterday.

## 2012-05-07 LAB — URINE CULTURE
Culture: NO GROWTH
Special Requests: NORMAL

## 2012-05-09 NOTE — ED Notes (Signed)
+  Chlamydia. Chart sent to EDP office for review. DHHS attached, 

## 2012-05-11 NOTE — ED Notes (Signed)
Chart returned from EDP office. Rx written by Lemont Fillers for Azithromycin 250 mg tab #4  Needs to be called to pharmacy.

## 2012-05-15 ENCOUNTER — Telehealth (HOSPITAL_COMMUNITY): Payer: Self-pay | Admitting: Emergency Medicine

## 2012-05-16 ENCOUNTER — Telehealth (HOSPITAL_COMMUNITY): Payer: Self-pay | Admitting: Emergency Medicine

## 2012-05-17 NOTE — ED Notes (Signed)
Unable to contact via phone.'Letter sent to EPIC address. 

## 2012-06-06 NOTE — ED Notes (Signed)
No response to letter sent after 30 days. Chart sent to Medical Records per protocol. °

## 2013-05-03 ENCOUNTER — Encounter (HOSPITAL_COMMUNITY): Payer: Self-pay | Admitting: *Deleted

## 2013-05-03 ENCOUNTER — Inpatient Hospital Stay (HOSPITAL_COMMUNITY)
Admission: AD | Admit: 2013-05-03 | Discharge: 2013-05-03 | Disposition: A | Discharge status: Home / Self Care | Payer: Medicare (Managed Care) | Source: Ambulatory Visit | Attending: Obstetrics & Gynecology | Admitting: Obstetrics & Gynecology

## 2013-05-03 DIAGNOSIS — O99891 Other specified diseases and conditions complicating pregnancy: Secondary | ICD-10-CM | POA: Insufficient documentation

## 2013-05-03 DIAGNOSIS — O26899 Other specified pregnancy related conditions, unspecified trimester: Secondary | ICD-10-CM

## 2013-05-03 DIAGNOSIS — R21 Rash and other nonspecific skin eruption: Secondary | ICD-10-CM | POA: Insufficient documentation

## 2013-05-03 DIAGNOSIS — L988 Other specified disorders of the skin and subcutaneous tissue: Secondary | ICD-10-CM

## 2013-05-03 DIAGNOSIS — O0930 Supervision of pregnancy with insufficient antenatal care, unspecified trimester: Secondary | ICD-10-CM | POA: Insufficient documentation

## 2013-05-03 DIAGNOSIS — O2686 Pruritic urticarial papules and plaques of pregnancy (PUPPP): Secondary | ICD-10-CM

## 2013-05-03 MED ORDER — HYDROCORTISONE 0.5 % EX CREA: TOPICAL_CREAM | Freq: Two times a day (BID) | CUTANEOUS | Status: DC

## 2013-05-03 MED ORDER — BENZOYL PEROXIDE 5 % EX CREA: TOPICAL_CREAM | Freq: Every day | CUTANEOUS | Status: DC

## 2013-05-03 NOTE — MAU Note (Signed)
Pt states she had an ultrasound at [facility] in [place], [place] and was told her EDC:08-09-12.  Pt moved to [facility] Oct. 1, 2014.
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
## 2013-05-03 NOTE — MAU Note (Signed)
PT SAYS HER LMP WAS 5-3-   AND SHE WENT TO [place]   AT [facility]-  AND SAID POSTIVE PREG TEST.  .  SO   SHE HAS NO PNC-  AND IS APPROX- 24.[redacted] WEEKS GESTATION  .   NO BIRTH CONTROL.  LAST SEX-  SEPT.      URGENT CARE TOLD  DID A BLOOD TEST AND TOLD HER SHE HAD HSV.     PT SAYS SHE  HAS DARK SPOTS ON HER RIGHT BREAST AND UNDER LEFT BREAST  , FOREHEAD-    THEY  DON'T ITCH, HURT, BLEED NOT HARD.-  STARTED ON 9-17

## 2013-05-09 NOTE — MAU Provider Note (Signed)
Attestation of Attending Supervision of Advanced Practitioner (CNM/NP): Evaluation and management procedures were performed by the Advanced Practitioner under my supervision and collaboration.  I have reviewed the Advanced Practitioner's note and chart, and I agree with the management and plan.  HARRAWAY-SMITH, Shirleymae Hauth 10:53 AM     

## 2013-05-26 ENCOUNTER — Other Ambulatory Visit (HOSPITAL_COMMUNITY): Payer: Self-pay | Admitting: Physician Assistant

## 2013-05-26 DIAGNOSIS — Z3689 Encounter for other specified antenatal screening: Secondary | ICD-10-CM

## 2013-05-26 LAB — OB RESULTS CONSOLE HEPATITIS B SURFACE ANTIGEN: HEP B S AG: NEGATIVE

## 2013-05-26 LAB — OB RESULTS CONSOLE ABO/RH: RH Type: POSITIVE

## 2013-05-26 LAB — OB RESULTS CONSOLE RPR: RPR: NONREACTIVE

## 2013-05-26 LAB — OB RESULTS CONSOLE GC/CHLAMYDIA
Chlamydia: NEGATIVE
GC PROBE AMP, GENITAL: NEGATIVE

## 2013-05-26 LAB — OB RESULTS CONSOLE ANTIBODY SCREEN: Antibody Screen: NEGATIVE

## 2013-05-26 LAB — OB RESULTS CONSOLE HIV ANTIBODY (ROUTINE TESTING): HIV: NONREACTIVE

## 2013-05-26 LAB — OB RESULTS CONSOLE RUBELLA ANTIBODY, IGM: Rubella: IMMUNE

## 2013-05-29 ENCOUNTER — Encounter (HOSPITAL_COMMUNITY): Payer: Self-pay | Admitting: *Deleted

## 2013-05-29 ENCOUNTER — Inpatient Hospital Stay (HOSPITAL_COMMUNITY)
Admission: AD | Admit: 2013-05-29 | Discharge: 2013-05-29 | Disposition: A | Discharge status: Home / Self Care | Payer: Medicare (Managed Care) | Source: Ambulatory Visit | Attending: Obstetrics & Gynecology | Admitting: Obstetrics & Gynecology

## 2013-05-29 DIAGNOSIS — M545 Low back pain, unspecified: Secondary | ICD-10-CM

## 2013-05-29 DIAGNOSIS — O0930 Supervision of pregnancy with insufficient antenatal care, unspecified trimester: Secondary | ICD-10-CM | POA: Insufficient documentation

## 2013-05-29 DIAGNOSIS — O99891 Other specified diseases and conditions complicating pregnancy: Secondary | ICD-10-CM

## 2013-05-29 DIAGNOSIS — M549 Dorsalgia, unspecified: Secondary | ICD-10-CM | POA: Insufficient documentation

## 2013-05-29 DIAGNOSIS — R42 Dizziness and giddiness: Secondary | ICD-10-CM

## 2013-05-29 HISTORY — DX: Unspecified asthma, uncomplicated: J45.909

## 2013-05-29 LAB — URINALYSIS, ROUTINE W REFLEX MICROSCOPIC
Glucose, UA: NEGATIVE mg/dL
Ketones, ur: NEGATIVE mg/dL
Leukocytes, UA: NEGATIVE
Nitrite: NEGATIVE
Protein, ur: NEGATIVE mg/dL
pH: 6.5 (ref 5.0–8.0)

## 2013-05-29 NOTE — MAU Provider Note (Signed)
History     CSN: [id number]  Arrival date and time: 05/29/13 1122   First Provider Initiated Contact with Patient 05/29/13 1216      Chief Complaint  Patient presents with  . Dizziness  . Back Pain   HPI Karen Lester is a 27 y.o. [phone] at [redacted]w[redacted]d presents for evaluation of back pain and light headedness when she is at work. Pt states that she has low back pain that radiates to her left leg and is worse after standing for prolonged periods of time. Pt also states that she occasionally becomes dizzy when standing for prolonged periods and improves with sitting. Pt has never lost conciousness. Pt reports that she is not drinking water and that she has had limited prenatal care with [facility].   OB History   Grav Para Term Preterm Abortions TAB SAB Ect Mult Living   6 4 4  1 1    4       Past Medical History  Diagnosis Date  . Herpes   . Urinary tract infection   . ADHD (attention deficit hyperactivity disorder)   . Bipolar 1 disorder   . Anxiety   . Asthma   . Abnormal Pap smear     had colpo  . Depression     doing fine, not currently on meds    Past Surgical History  Procedure Laterality Date  . Induced abortion      Family History  Problem Relation Age of Onset  . Hypertension Mother   . Mental retardation Brother     down's syndrome    History  Substance Use Topics  . Smoking status: Current Every Day Smoker -- 0.25 packs/day for 16 years    Types: Cigarettes  . Smokeless tobacco: Never Used  . Alcohol Use: 0.0 oz/week     Comment: OCcas.    Allergies: No Known Allergies  Prescriptions prior to admission  Medication Sig Dispense Refill  . benzoyl peroxide 5 % cream Apply topically at bedtime.  113.4 g  0  . hydrocortisone cream 0.5 % Apply topically 2 (two) times daily.  30 g  0  . Prenatal Vit-Fe Fumarate-FA (PRENATAL MULTIVITAMIN) TABS tablet Take 1 tablet by mouth daily at 12 noon.        ROS Physical Exam    Blood pressure 100/63, pulse 112, temperature 98.7 F (37.1 C), temperature source Oral, resp. rate 18, height 5\' 4"  (1.626 m), weight 91.627 kg (202 lb), last menstrual period 11/13/2012.  Physical Exam VSS, neg orthostatics RRR no mgt CTAB no wrc Sore to palpation over scarum. Normal ROM of lower extremities. 5/5 strength, normal sensation bialterally.  FHT: 140s mod var, 2 accesl >10x10, no decels No ctx  Results for IZAMAR, LINDEN (MRN [id number]) as of 05/29/2013 12:34  Ref. Range 05/29/2013 11:35  Color, Urine Latest Range: YELLOW  YELLOW  APPearance Latest Range: CLEAR  CLEAR  Specific Gravity, Urine Latest Range: 1.005-1.030  1.020  pH Latest Range: 5.0-8.0  6.5  Glucose Latest Range: NEGATIVE mg/dL NEGATIVE  Bilirubin Urine Latest Range: NEGATIVE  NEGATIVE  Ketones, ur Latest Range: NEGATIVE mg/dL NEGATIVE  Protein Latest Range: NEGATIVE mg/dL NEGATIVE  Urobilinogen, UA Latest Range: 0.0-1.0 mg/dL 0.2  Nitrite Latest Range: NEGATIVE  NEGATIVE  Leukocytes, UA Latest Range: NEGATIVE  NEGATIVE  Hgb urine dipstick Latest Range: NEGATIVE  NEGATIVE   MAU Course  Procedures  MDM Reassuring evaluation. No acute issues.   Assessment and  Plan  Karen Lester is a 27 y.o. [phone] at [redacted]w[redacted]d presents for evaluation of back pain and dizziness. Most consistent with vasovagal reflex after prolonged standing. Recommend increased hydration, scheduled rest at work and follow up as scheduled with her OB provider. Pt given stretching exercises for her back and had some improvement while here with exercises. Pt discharged home with PTL precuations. Reassuring and reactive NST in 28 wk fetus  Karen Lester 05/29/2013, 12:27 PM

## 2013-05-29 NOTE — MAU Note (Signed)
Been having back pain. Started yesterday when at work, shortly after she got there, had to leave work.  Had another episode this morning.  Has also been having dizziness- past 3 days.  Comes and goes, more of a problem when on her feet and at work.  Thinks she needs a sonogram, hasn't had one- wants to be sure it's not something wrong with the baby.  Just started going to the health dept.

## 2013-05-31 ENCOUNTER — Ambulatory Visit (HOSPITAL_COMMUNITY): Admission: RE | Admit: 2013-05-31 | Payer: Medicaid Other | Source: Ambulatory Visit

## 2013-05-31 ENCOUNTER — Ambulatory Visit (HOSPITAL_COMMUNITY)
Admission: RE | Admit: 2013-05-31 | Discharge: 2013-05-31 | Disposition: A | Discharge status: Home / Self Care | Payer: Medicaid Other | Source: Ambulatory Visit | Attending: Physician Assistant | Admitting: Physician Assistant

## 2013-05-31 DIAGNOSIS — Z3689 Encounter for other specified antenatal screening: Secondary | ICD-10-CM | POA: Insufficient documentation

## 2013-05-31 DIAGNOSIS — O0930 Supervision of pregnancy with insufficient antenatal care, unspecified trimester: Secondary | ICD-10-CM | POA: Insufficient documentation

## 2013-06-08 ENCOUNTER — Emergency Department (HOSPITAL_COMMUNITY): Payer: No Typology Code available for payment source

## 2013-06-08 ENCOUNTER — Encounter (HOSPITAL_COMMUNITY): Payer: Self-pay | Admitting: Emergency Medicine

## 2013-06-08 ENCOUNTER — Emergency Department (HOSPITAL_COMMUNITY)
Admission: EM | Admit: 2013-06-08 | Discharge: 2013-06-08 | Discharge status: Against Medical Advice | Payer: No Typology Code available for payment source | Source: Ambulatory Visit | Attending: Obstetrics & Gynecology | Admitting: Obstetrics & Gynecology

## 2013-06-08 DIAGNOSIS — M79609 Pain in unspecified limb: Secondary | ICD-10-CM | POA: Insufficient documentation

## 2013-06-08 DIAGNOSIS — O99330 Smoking (tobacco) complicating pregnancy, unspecified trimester: Secondary | ICD-10-CM | POA: Insufficient documentation

## 2013-06-08 DIAGNOSIS — M545 Low back pain, unspecified: Secondary | ICD-10-CM | POA: Insufficient documentation

## 2013-06-08 DIAGNOSIS — S39012A Strain of muscle, fascia and tendon of lower back, initial encounter: Secondary | ICD-10-CM

## 2013-06-08 DIAGNOSIS — O4700 False labor before 37 completed weeks of gestation, unspecified trimester: Secondary | ICD-10-CM | POA: Insufficient documentation

## 2013-06-08 DIAGNOSIS — S335XXA Sprain of ligaments of lumbar spine, initial encounter: Secondary | ICD-10-CM | POA: Insufficient documentation

## 2013-06-08 DIAGNOSIS — Y92410 Unspecified street and highway as the place of occurrence of the external cause: Secondary | ICD-10-CM | POA: Diagnosis not present

## 2013-06-08 DIAGNOSIS — O99891 Other specified diseases and conditions complicating pregnancy: Secondary | ICD-10-CM | POA: Insufficient documentation

## 2013-06-08 DIAGNOSIS — Z3490 Encounter for supervision of normal pregnancy, unspecified, unspecified trimester: Secondary | ICD-10-CM

## 2013-06-08 MED ORDER — ACETAMINOPHEN 325 MG PO TABS
650.0000 mg | ORAL_TABLET | Freq: Once | ORAL | Status: AC
  Administered 2013-06-08: 650 mg via ORAL
  Filled 2013-06-08: qty 2

## 2013-06-08 MED ORDER — CYCLOBENZAPRINE HCL 10 MG PO TABS
10.0000 mg | ORAL_TABLET | Freq: Once | ORAL | Status: AC
  Administered 2013-06-08: 10 mg via ORAL
  Filled 2013-06-08: qty 1

## 2013-06-08 MED ORDER — CYCLOBENZAPRINE HCL 5 MG PO TABS
5.0000 mg | ORAL_TABLET | Freq: Once | ORAL | Status: AC
  Administered 2013-06-08: 5 mg via ORAL
  Filled 2013-06-08: qty 1

## 2013-06-08 NOTE — Progress Notes (Signed)
Patient removed self from the monitor and dressed, requesting to be discharged. States her ride is here
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
## 2013-06-08 NOTE — ED Notes (Signed)
Rapid response Women's RN here with patient
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
## 2013-06-08 NOTE — ED Notes (Signed)
Rapid obgyn nurse called
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
## 2013-06-08 NOTE — ED Notes (Signed)
Bed: WA01 Expected date:  Expected time:  Means of arrival:  Comments: ems- pregnant

## 2013-06-08 NOTE — MAU Note (Signed)
Patient states she wants to leave AMA after risk and benefits were explained, and CNM explained a need to continue FHR, toco monitoring. Patient still refuses care. Discharged AMA

## 2013-06-08 NOTE — ED Notes (Signed)
OB Rapid Response advised Karen Lester has accepted patient at [facility] hospital. MD [facility] aware.

## 2013-06-08 NOTE — Progress Notes (Signed)
Dr. Anitra Lauth aware of Dr. Macon Large accepting pt transfer.

## 2013-06-08 NOTE — Progress Notes (Signed)
Report called to [facility] RN at [facility] MAU.
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
## 2013-06-16 NOTE — MAU Provider Note (Signed)
Attestation of Attending Supervision of Advanced Practitioner (PA/CNM/NP): Evaluation and management procedures were performed by the Advanced Practitioner under my supervision and collaboration.  I have reviewed the Advanced Practitioner's note and chart, and I agree with the management and plan.  Jaleil Renwick, MD, FACOG Attending Obstetrician & Gynecologist Faculty Practice, [facility]  

## 2013-06-24 ENCOUNTER — Encounter: Payer: Self-pay | Admitting: *Deleted

## 2013-06-25 ENCOUNTER — Encounter (HOSPITAL_COMMUNITY): Payer: Self-pay | Admitting: *Deleted

## 2013-06-25 ENCOUNTER — Inpatient Hospital Stay (HOSPITAL_COMMUNITY)
Admission: AD | Admit: 2013-06-25 | Discharge: 2013-06-25 | Disposition: A | Discharge status: Home / Self Care | Payer: Medicaid Other | Source: Ambulatory Visit | Attending: Obstetrics & Gynecology | Admitting: Obstetrics & Gynecology

## 2013-06-25 DIAGNOSIS — M549 Dorsalgia, unspecified: Secondary | ICD-10-CM | POA: Insufficient documentation

## 2013-06-25 DIAGNOSIS — O99891 Other specified diseases and conditions complicating pregnancy: Secondary | ICD-10-CM | POA: Insufficient documentation

## 2013-06-25 DIAGNOSIS — IMO0001 Reserved for inherently not codable concepts without codable children: Secondary | ICD-10-CM

## 2013-06-25 DIAGNOSIS — M7918 Myalgia, other site: Secondary | ICD-10-CM

## 2013-06-25 LAB — URINALYSIS, ROUTINE W REFLEX MICROSCOPIC
Bilirubin Urine: NEGATIVE
Ketones, ur: NEGATIVE mg/dL
Leukocytes, UA: NEGATIVE
Nitrite: NEGATIVE
Specific Gravity, Urine: 1.025 (ref 1.005–1.030)
Urobilinogen, UA: 0.2 mg/dL (ref 0.0–1.0)

## 2013-06-25 MED ORDER — CYCLOBENZAPRINE HCL 10 MG PO TABS
10.0000 mg | ORAL_TABLET | Freq: Once | ORAL | Status: AC
  Administered 2013-06-25: 10 mg via ORAL
  Filled 2013-06-25: qty 1

## 2013-06-25 MED ORDER — CYCLOBENZAPRINE HCL 10 MG PO TABS: 10.0000 mg | ORAL_TABLET | Freq: Three times a day (TID) | ORAL | Status: DC | PRN

## 2013-06-25 MED ORDER — OXYCODONE-ACETAMINOPHEN 5-325 MG PO TABS
1.0000 | ORAL_TABLET | Freq: Once | ORAL | Status: AC
  Administered 2013-06-25: 1 via ORAL
  Filled 2013-06-25: qty 1

## 2013-06-25 MED ORDER — OXYCODONE-ACETAMINOPHEN 5-325 MG PO TABS
2.0000 | ORAL_TABLET | Freq: Once | ORAL | Status: AC
  Administered 2013-06-25: 1 via ORAL
  Filled 2013-06-25: qty 2
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
## 2013-07-04 LAB — OB RESULTS CONSOLE GBS: GBS: POSITIVE

## 2013-07-12 ENCOUNTER — Encounter (HOSPITAL_COMMUNITY): Payer: Self-pay | Admitting: *Deleted

## 2013-07-12 ENCOUNTER — Inpatient Hospital Stay (HOSPITAL_COMMUNITY)
Admission: AD | Admit: 2013-07-12 | Discharge: 2013-07-12 | Disposition: A | Discharge status: Home / Self Care | Payer: Medicare (Managed Care) | Source: Ambulatory Visit | Attending: Obstetrics & Gynecology | Admitting: Obstetrics & Gynecology

## 2013-07-12 DIAGNOSIS — R109 Unspecified abdominal pain: Secondary | ICD-10-CM | POA: Insufficient documentation

## 2013-07-12 DIAGNOSIS — O99891 Other specified diseases and conditions complicating pregnancy: Secondary | ICD-10-CM | POA: Insufficient documentation

## 2013-07-12 NOTE — Progress Notes (Signed)
standing

## 2013-07-12 NOTE — Progress Notes (Signed)
Standing BP, pulse was initially 116/min, rechecked and is 88/min
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
## 2013-07-12 NOTE — Progress Notes (Signed)
sitting

## 2013-07-12 NOTE — Progress Notes (Signed)
lying

## 2013-07-14 NOTE — L&D Delivery Note (Signed)
Delivery Note At 8:22 PM a viable female was delivered via Vaginal, Spontaneous Delivery (Presentation: ; Occiput Anterior).  APGAR: 8, 9; weight 8+6.   Placenta status: spontaneous, intact .  Cord: 3 vessels with the following complications: None.  Cord pH: not sent  Anesthesia: Epidural  Episiotomy: None Lacerations: None Suture Repair: N/A Est. Blood Loss (mL): 100  Mom to postpartum.  Baby to Couplet care / Skin to Skin.  Selena LesserBraimah, Tina 08/03/2013, 8:43 PM  I have seen and examined this patient and I agree with the above. Cam HaiSHAW, KIMBERLY 9:58 PM 08/03/2013

## 2013-07-27 ENCOUNTER — Encounter (HOSPITAL_COMMUNITY): Payer: Self-pay | Admitting: *Deleted

## 2013-07-27 ENCOUNTER — Inpatient Hospital Stay (HOSPITAL_COMMUNITY)
Admission: AD | Admit: 2013-07-27 | Discharge: 2013-07-27 | Disposition: A | Discharge status: Home / Self Care | Payer: Medicare Other | Source: Ambulatory Visit | Attending: Obstetrics and Gynecology | Admitting: Obstetrics and Gynecology

## 2013-07-27 DIAGNOSIS — O479 False labor, unspecified: Secondary | ICD-10-CM | POA: Insufficient documentation

## 2013-07-27 DIAGNOSIS — N898 Other specified noninflammatory disorders of vagina: Secondary | ICD-10-CM | POA: Insufficient documentation

## 2013-07-27 DIAGNOSIS — Z87891 Personal history of nicotine dependence: Secondary | ICD-10-CM | POA: Insufficient documentation

## 2013-07-27 LAB — WET PREP, GENITAL
CLUE CELLS WET PREP: NONE SEEN
TRICH WET PREP: NONE SEEN
YEAST WET PREP: NONE SEEN

## 2013-07-27 NOTE — MAU Note (Signed)
Pt presents with complaints of contractions since last night and LOF today.

## 2013-07-27 NOTE — Progress Notes (Signed)
Patient will not lie in position conducive to monitoring. Tracing reactive with no UC's. White vaginal D/C noted, negative for ferning.  EFM D/C'd. Patient given cranberry juice and warm blanket as requested. Informed patient that MD will be called to come and see her. Patient asked for crackers. Instructed  clear liquids until MD evaluates her. Patient angry and belligerent since arrival.
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
## 2013-07-27 NOTE — Discharge Instructions (Signed)

## 2013-07-28 LAB — GC/CHLAMYDIA PROBE AMP
CT PROBE, AMP APTIMA: NEGATIVE
GC PROBE AMP APTIMA: NEGATIVE

## 2013-07-28 NOTE — MAU Provider Note (Signed)
Attestation of Attending Supervision of Advanced Practitioner (CNM/NP): Evaluation and management procedures were performed by the Advanced Practitioner under my supervision and collaboration.  I have reviewed the Advanced Practitioner's note and chart, and I agree with the management and plan.  Tajana Crotteau 07/28/2013 1:44 PM   

## 2013-07-29 ENCOUNTER — Telehealth (HOSPITAL_COMMUNITY): Payer: Self-pay | Admitting: *Deleted

## 2013-07-29 ENCOUNTER — Encounter (HOSPITAL_COMMUNITY): Payer: Self-pay | Admitting: *Deleted

## 2013-07-29 ENCOUNTER — Other Ambulatory Visit (HOSPITAL_COMMUNITY): Payer: Self-pay | Admitting: Nurse Practitioner

## 2013-07-29 DIAGNOSIS — O480 Post-term pregnancy: Secondary | ICD-10-CM

## 2013-07-29 NOTE — Telephone Encounter (Signed)
Preadmission screen  

## 2013-08-01 ENCOUNTER — Ambulatory Visit (HOSPITAL_COMMUNITY)
Admission: RE | Admit: 2013-08-01 | Discharge: 2013-08-01 | Disposition: A | Discharge status: Home / Self Care | Payer: Medicare Other | Source: Ambulatory Visit | Attending: Nurse Practitioner | Admitting: Nurse Practitioner

## 2013-08-01 ENCOUNTER — Encounter (HOSPITAL_COMMUNITY): Payer: Self-pay | Admitting: *Deleted

## 2013-08-01 ENCOUNTER — Inpatient Hospital Stay (HOSPITAL_COMMUNITY)
Admission: AD | Admit: 2013-08-01 | Discharge: 2013-08-01 | Disposition: A | Discharge status: Home / Self Care | Payer: Medicare Other | Source: Ambulatory Visit | Attending: Family Medicine | Admitting: Family Medicine

## 2013-08-01 DIAGNOSIS — O480 Post-term pregnancy: Secondary | ICD-10-CM

## 2013-08-01 DIAGNOSIS — Z3689 Encounter for other specified antenatal screening: Secondary | ICD-10-CM | POA: Insufficient documentation

## 2013-08-01 NOTE — MAU Provider Note (Signed)
Chief Complaint:  Non-stress Test      HPI: Karen Lester is a 28 y.o. [id number] at 3965w5d [facility]. Here for NST and had BPP.  Denies contractions, leakage of fluid or vaginal bleeding. Good fetal movement.   Pregnancy Course: essentially uncomplicated  Past Medical History: Past Medical History  Diagnosis Date  . Herpes   . Urinary tract infection   . ADHD (attention deficit hyperactivity disorder)   . Bipolar 1 disorder   . Anxiety   . Asthma   . Abnormal Pap smear     had colpo  . Depression     doing fine, not currently on meds  . Vaginal Pap smear, abnormal     Past obstetric history: OB History  Gravida Para Term Preterm AB SAB TAB Ectopic Multiple Living  6 4 4  1  1   4     # Outcome Date GA Lbr Len/2nd Weight Sex Delivery Anes PTL Lv  6 CUR           5 TAB 05/2012          4 TRM 07/14/11 474w2d 07:00 3.345 kg (7 lb 6 oz) M SVD None N Y  3 TRM 02/16/09 2461w0d 04:00 3.317 kg (7 lb 5 oz) M SVD EPI N Y  2 TRM 10/07/06 7661w0d 03:00 3.317 kg (7 lb 5 oz) F SVD EPI N Y  1 TRM 04/12/04 7434w0d 06:00 4.026 kg (8 lb 14 oz) M SVD EPI N Y      Past Surgical History: Past Surgical History  Procedure Laterality Date  . Induced abortion    . Colposcopy       Family History: Family History  Problem Relation Age of Onset  . Hypertension Mother   . Mental retardation Brother     down's syndrome  . Asthma Son     Social History: History  Substance Use Topics  . Smoking status: Former Smoker -- 0.25 packs/day for 16 years    Types: Cigarettes    Quit date: 06/13/2013  . Smokeless tobacco: Never Used  . Alcohol Use: No     Comment: OCcas.    Allergies: No Known Allergies  Meds:  Prescriptions prior to admission  Medication Sig Dispense Refill  . Prenatal Vit-Fe Fumarate-FA (PRENATAL MULTIVITAMIN) TABS tablet Take 1 tablet by mouth daily at 12 noon.        ROS: Pertinent findings in history of present illness.  Physical Exam   Blood pressure 107/64, pulse 92, temperature 97.6 F (36.4 C), temperature source Oral, resp. rate 18, height 5\' 5"  (1.651 m), weight 96.616 kg (213 lb), last menstrual period 11/13/2012. GENERAL: Well-developed, well-nourished female in no acute distress.  HEENT: normocephalic HEART: normal rate RESP: normal effort ABDOMEN: Soft, non-tender, gravid appropriate for gestational age EXTREMITIES: Nontender, no edema NEURO: alert and oriented  Dilation: 2.5 Effacement (%): Thick Station: -3 Exam by:: D Ruchel Brandenburger CNM  FHT:  Baseline 145 , moderate variability, accelerations present, no decelerations Contractions: occ   Labs: No results found for this or any previous visit (from the past 24 hour(s)).  Imaging:  [place] Fetal Bpp W/o Non Stress  08/01/2013   OBSTETRICAL ULTRASOUND: This exam was performed within a [facility]. The OB US report was generated in the AS system, and faxed to the ordering physician.   This report is also available in [facility]'s [employer] and in the [employer] PACS. See AS Obstetric US report. BPP 6/8, -  2 FBM  MAU Course:   Assessment: 1. NST (non-stress test) reactive   Multip 40.5 Reassuring fetal testing  Plan: Discharge home Labor precautions and fetal kick counts    Medication List         prenatal multivitamin Tabs tablet  Take 1 tablet by mouth daily at 12 noon.       Follow-up Information   Follow up with [facility] [place]. Schedule an appointment as soon as possible for a visit in 3 days.   Contact information:   [address] [place] [address] [phone]     Danae Orleans, CNM 08/01/2013 2:39 PM

## 2013-08-01 NOTE — MAU Note (Signed)
Sent from u/s department for a NST;

## 2013-08-01 NOTE — Discharge Instructions (Signed)
Fetal Movement Counts Patient Name: __________________________________________________ Patient Due Date: ____________________ Performing a fetal movement count is highly recommended in high-risk pregnancies, but it is good for every pregnant woman to do. Your caregiver may ask you to start counting fetal movements at 28 weeks of the pregnancy. Fetal movements often increase:  After eating a full meal.  After physical activity.  After eating or drinking something sweet or cold.  At rest. Pay attention to when you feel the baby is most active. This will help you notice a pattern of your baby's sleep and wake cycles and what factors contribute to an increase in fetal movement. It is important to perform a fetal movement count at the same time each day when your baby is normally most active.  HOW TO COUNT FETAL MOVEMENTS 1. Find a quiet and comfortable area to sit or lie down on your left side. Lying on your left side provides the best blood and oxygen circulation to your baby. 2. Write down the day and time on a sheet of paper or in a journal. 3. Start counting kicks, flutters, swishes, rolls, or jabs in a 2 hour period. You should feel at least 10 movements within 2 hours. 4. If you do not feel 10 movements in 2 hours, wait 2 3 hours and count again. Look for a change in the pattern or not enough counts in 2 hours. SEEK MEDICAL CARE IF:  You feel less than 10 counts in 2 hours, tried twice.  There is no movement in over an hour.  The pattern is changing or taking longer each day to reach 10 counts in 2 hours.  You feel the baby is not moving as he or she usually does. Date: ____________ Movements: ____________ Start time: ____________ Finish time: ____________  Date: ____________ Movements: ____________ Start time: ____________ Finish time: ____________ Date: ____________ Movements: ____________ Start time: ____________ Finish time: ____________ Date: ____________ Movements: ____________  Start time: ____________ Finish time: ____________ Date: ____________ Movements: ____________ Start time: ____________ Finish time: ____________ Date: ____________ Movements: ____________ Start time: ____________ Finish time: ____________ Date: ____________ Movements: ____________ Start time: ____________ Finish time: ____________ Date: ____________ Movements: ____________ Start time: ____________ Finish time: ____________  Date: ____________ Movements: ____________ Start time: ____________ Finish time: ____________ Date: ____________ Movements: ____________ Start time: ____________ Finish time: ____________ Date: ____________ Movements: ____________ Start time: ____________ Finish time: ____________ Date: ____________ Movements: ____________ Start time: ____________ Finish time: ____________ Date: ____________ Movements: ____________ Start time: ____________ Finish time: ____________ Date: ____________ Movements: ____________ Start time: ____________ Finish time: ____________ Date: ____________ Movements: ____________ Start time: ____________ Finish time: ____________  Date: ____________ Movements: ____________ Start time: ____________ Finish time: ____________ Date: ____________ Movements: ____________ Start time: ____________ Finish time: ____________ Date: ____________ Movements: ____________ Start time: ____________ Finish time: ____________ Date: ____________ Movements: ____________ Start time: ____________ Finish time: ____________ Date: ____________ Movements: ____________ Start time: ____________ Finish time: ____________ Date: ____________ Movements: ____________ Start time: ____________ Finish time: ____________ Date: ____________ Movements: ____________ Start time: ____________ Finish time: ____________  Date: ____________ Movements: ____________ Start time: ____________ Finish time: ____________ Date: ____________ Movements: ____________ Start time: ____________ Finish time:  ____________ Date: ____________ Movements: ____________ Start time: ____________ Finish time: ____________ Date: ____________ Movements: ____________ Start time: ____________ Finish time: ____________ Date: ____________ Movements: ____________ Start time: ____________ Finish time: ____________ Date: ____________ Movements: ____________ Start time: ____________ Finish time: ____________ Date: ____________ Movements: ____________ Start time: ____________ Finish time: ____________  Date: ____________ Movements: ____________ Start time: ____________ Finish   time: ____________ Date: ____________ Movements: ____________ Start time: ____________ Finish time: ____________ Date: ____________ Movements: ____________ Start time: ____________ Finish time: ____________ Date: ____________ Movements: ____________ Start time: ____________ Finish time: ____________ Date: ____________ Movements: ____________ Start time: ____________ Finish time: ____________ Date: ____________ Movements: ____________ Start time: ____________ Finish time: ____________ Date: ____________ Movements: ____________ Start time: ____________ Finish time: ____________  Date: ____________ Movements: ____________ Start time: ____________ Finish time: ____________ Date: ____________ Movements: ____________ Start time: ____________ Finish time: ____________ Date: ____________ Movements: ____________ Start time: ____________ Finish time: ____________ Date: ____________ Movements: ____________ Start time: ____________ Finish time: ____________ Date: ____________ Movements: ____________ Start time: ____________ Finish time: ____________ Date: ____________ Movements: ____________ Start time: ____________ Finish time: ____________ Date: ____________ Movements: ____________ Start time: ____________ Finish time: ____________  Date: ____________ Movements: ____________ Start time: ____________ Finish time: ____________ Date: ____________ Movements:  ____________ Start time: ____________ Finish time: ____________ Date: ____________ Movements: ____________ Start time: ____________ Finish time: ____________ Date: ____________ Movements: ____________ Start time: ____________ Finish time: ____________ Date: ____________ Movements: ____________ Start time: ____________ Finish time: ____________ Date: ____________ Movements: ____________ Start time: ____________ Finish time: ____________ Date: ____________ Movements: ____________ Start time: ____________ Finish time: ____________  Date: ____________ Movements: ____________ Start time: ____________ Finish time: ____________ Date: ____________ Movements: ____________ Start time: ____________ Finish time: ____________ Date: ____________ Movements: ____________ Start time: ____________ Finish time: ____________ Date: ____________ Movements: ____________ Start time: ____________ Finish time: ____________ Date: ____________ Movements: ____________ Start time: ____________ Finish time: ____________ Date: ____________ Movements: ____________ Start time: ____________ Finish time: ____________ Document Released: 07/30/2006 Document Revised: 06/16/2012 Document Reviewed: 04/26/2012 Exit[employer] Patient Information 2014 [employer].  

## 2013-08-01 NOTE — MAU Provider Note (Signed)
Attestation of Attending Supervision of Advanced Practitioner (PA/CNM/NP): Evaluation and management procedures were performed by the Advanced Practitioner under my supervision and collaboration.  I have reviewed the Advanced Practitioner's note and chart, and I agree with the management and plan.  Reva BoresPRATT,Carlas Vandyne S, MD [facility] Attending 08/01/2013 3:11 PM

## 2013-08-03 ENCOUNTER — Inpatient Hospital Stay (HOSPITAL_COMMUNITY)
Admission: AD | Admit: 2013-08-03 | Discharge: 2013-08-03 | Disposition: A | Discharge status: Home / Self Care | Payer: Medicare (Managed Care) | Source: Ambulatory Visit | Attending: Obstetrics & Gynecology | Admitting: Obstetrics & Gynecology

## 2013-08-03 ENCOUNTER — Inpatient Hospital Stay (HOSPITAL_COMMUNITY)
Admission: AD | Admit: 2013-08-03 | Discharge: 2013-08-05 | DRG: 767 | Disposition: A | Discharge status: Home / Self Care | Payer: Medicare Other | Source: Ambulatory Visit | Attending: Obstetrics & Gynecology | Admitting: Obstetrics & Gynecology

## 2013-08-03 ENCOUNTER — Inpatient Hospital Stay (HOSPITAL_COMMUNITY): Payer: Medicare Other | Admitting: Anesthesiology

## 2013-08-03 ENCOUNTER — Encounter (HOSPITAL_COMMUNITY): Payer: Self-pay

## 2013-08-03 ENCOUNTER — Encounter (HOSPITAL_COMMUNITY): Payer: Medicare Other | Admitting: Anesthesiology

## 2013-08-03 VITALS — BP 114/73 | HR 73 | Temp 97.6°F | Resp 20 | Ht 65.0 in | Wt 213.0 lb

## 2013-08-03 DIAGNOSIS — O9989 Other specified diseases and conditions complicating pregnancy, childbirth and the puerperium: Principal | ICD-10-CM

## 2013-08-03 DIAGNOSIS — O99344 Other mental disorders complicating childbirth: Secondary | ICD-10-CM

## 2013-08-03 DIAGNOSIS — Z302 Encounter for sterilization: Secondary | ICD-10-CM

## 2013-08-03 DIAGNOSIS — O99892 Other specified diseases and conditions complicating childbirth: Secondary | ICD-10-CM | POA: Diagnosis present

## 2013-08-03 DIAGNOSIS — O98519 Other viral diseases complicating pregnancy, unspecified trimester: Secondary | ICD-10-CM

## 2013-08-03 DIAGNOSIS — Z3490 Encounter for supervision of normal pregnancy, unspecified, unspecified trimester: Secondary | ICD-10-CM

## 2013-08-03 DIAGNOSIS — O0940 Supervision of pregnancy with grand multiparity, unspecified trimester: Secondary | ICD-10-CM

## 2013-08-03 DIAGNOSIS — O99891 Other specified diseases and conditions complicating pregnancy: Secondary | ICD-10-CM | POA: Insufficient documentation

## 2013-08-03 DIAGNOSIS — Z22330 Carrier of Group B streptococcus: Secondary | ICD-10-CM

## 2013-08-03 DIAGNOSIS — O368190 Decreased fetal movements, unspecified trimester, not applicable or unspecified: Secondary | ICD-10-CM | POA: Insufficient documentation

## 2013-08-03 DIAGNOSIS — Z3A41 41 weeks gestation of pregnancy: Secondary | ICD-10-CM

## 2013-08-03 DIAGNOSIS — M545 Low back pain, unspecified: Secondary | ICD-10-CM | POA: Insufficient documentation

## 2013-08-03 DIAGNOSIS — O480 Post-term pregnancy: Principal | ICD-10-CM | POA: Diagnosis present

## 2013-08-03 DIAGNOSIS — A6000 Herpesviral infection of urogenital system, unspecified: Secondary | ICD-10-CM | POA: Diagnosis present

## 2013-08-03 DIAGNOSIS — F1210 Cannabis abuse, uncomplicated: Secondary | ICD-10-CM | POA: Diagnosis present

## 2013-08-03 LAB — CBC
HCT: 32.7 % — ABNORMAL LOW (ref 36.0–46.0)
Hemoglobin: 11.2 g/dL — ABNORMAL LOW (ref 12.0–15.0)
MCH: 28.7 pg (ref 26.0–34.0)
MCHC: 34.3 g/dL (ref 30.0–36.0)
MCV: 83.8 fL (ref 78.0–100.0)
Platelets: 265 10*3/uL (ref 150–400)
RBC: 3.9 MIL/uL (ref 3.87–5.11)
RDW: 14.1 % (ref 11.5–15.5)
WBC: 8.7 10*3/uL (ref 4.0–10.5)

## 2013-08-03 LAB — TYPE AND SCREEN
ABO/RH(D): A POS
Antibody Screen: NEGATIVE

## 2013-08-03 LAB — RPR: RPR Ser Ql: NONREACTIVE

## 2013-08-03 MED ORDER — LACTATED RINGERS IV SOLN
INTRAVENOUS | Status: DC
  Administered 2013-08-03 (×2): via INTRAVENOUS

## 2013-08-03 MED ORDER — ZOLPIDEM TARTRATE 5 MG PO TABS: 5.0000 mg | ORAL_TABLET | Freq: Every evening | ORAL | Status: DC | PRN

## 2013-08-03 MED ORDER — IBUPROFEN 600 MG PO TABS
600.0000 mg | ORAL_TABLET | Freq: Four times a day (QID) | ORAL | Status: DC
Start: 2013-08-04 — End: 2013-08-05
  Administered 2013-08-03 – 2013-08-05 (×6): 600 mg via ORAL
  Filled 2013-08-03 (×6): qty 1

## 2013-08-03 MED ORDER — EPHEDRINE 5 MG/ML INJ
10.0000 mg | INTRAVENOUS | Status: DC | PRN
  Filled 2013-08-03: qty 2
  Filled 2013-08-03: qty 4

## 2013-08-03 MED ORDER — PENICILLIN G POTASSIUM 5000000 UNITS IJ SOLR
2.5000 10*6.[IU] | INTRAVENOUS | Status: DC
  Administered 2013-08-03 (×2): 2.5 10*6.[IU] via INTRAVENOUS
  Filled 2013-08-03 (×5): qty 2.5

## 2013-08-03 MED ORDER — SIMETHICONE 80 MG PO CHEW: 80.0000 mg | CHEWABLE_TABLET | ORAL | Status: DC | PRN

## 2013-08-03 MED ORDER — ONDANSETRON HCL 4 MG/2ML IJ SOLN: 4.0000 mg | Freq: Four times a day (QID) | INTRAMUSCULAR | Status: DC | PRN

## 2013-08-03 MED ORDER — PRENATAL MULTIVITAMIN CH: 1.0000 | ORAL_TABLET | Freq: Every day | ORAL | Status: DC

## 2013-08-03 MED ORDER — TETANUS-DIPHTH-ACELL PERTUSSIS 5-2.5-18.5 LF-MCG/0.5 IM SUSP: 0.5000 mL | Freq: Once | INTRAMUSCULAR | Status: DC

## 2013-08-03 MED ORDER — CYCLOBENZAPRINE HCL 10 MG PO TABS
10.0000 mg | ORAL_TABLET | Freq: Once | ORAL | Status: AC
  Administered 2013-08-03: 10 mg via ORAL
  Filled 2013-08-03: qty 1

## 2013-08-03 MED ORDER — OXYTOCIN 40 UNITS IN LACTATED RINGERS INFUSION - SIMPLE MED
1.0000 m[IU]/min | INTRAVENOUS | Status: DC
  Administered 2013-08-03: 2 m[IU]/min via INTRAVENOUS
  Filled 2013-08-03: qty 1000

## 2013-08-03 MED ORDER — SENNOSIDES-DOCUSATE SODIUM 8.6-50 MG PO TABS
2.0000 | ORAL_TABLET | ORAL | Status: DC
  Administered 2013-08-03 – 2013-08-04 (×2): 2 via ORAL
  Filled 2013-08-03 (×2): qty 2

## 2013-08-03 MED ORDER — DIBUCAINE 1 % RE OINT: 1.0000 | TOPICAL_OINTMENT | RECTAL | Status: DC | PRN

## 2013-08-03 MED ORDER — OXYTOCIN 40 UNITS IN LACTATED RINGERS INFUSION - SIMPLE MED: 62.5000 mL/h | INTRAVENOUS | Status: DC

## 2013-08-03 MED ORDER — EPHEDRINE 5 MG/ML INJ
10.0000 mg | INTRAVENOUS | Status: DC | PRN
  Filled 2013-08-03: qty 2

## 2013-08-03 MED ORDER — FENTANYL 2.5 MCG/ML BUPIVACAINE 1/10 % EPIDURAL INFUSION (WH - ANES)
14.0000 mL/h | INTRAMUSCULAR | Status: DC | PRN
  Filled 2013-08-03: qty 125

## 2013-08-03 MED ORDER — ONDANSETRON HCL 4 MG/2ML IJ SOLN: 4.0000 mg | INTRAMUSCULAR | Status: DC | PRN

## 2013-08-03 MED ORDER — LIDOCAINE HCL (PF) 1 % IJ SOLN
INTRAMUSCULAR | Status: DC | PRN
  Administered 2013-08-03 (×2): 4 mL

## 2013-08-03 MED ORDER — WITCH HAZEL-GLYCERIN EX PADS
1.0000 "application " | MEDICATED_PAD | CUTANEOUS | Status: DC | PRN
  Administered 2013-08-03: 1 via TOPICAL

## 2013-08-03 MED ORDER — LACTATED RINGERS IV SOLN: 500.0000 mL | Freq: Once | INTRAVENOUS | Status: DC

## 2013-08-03 MED ORDER — PHENYLEPHRINE 40 MCG/ML (10ML) SYRINGE FOR IV PUSH (FOR BLOOD PRESSURE SUPPORT)
80.0000 ug | PREFILLED_SYRINGE | INTRAVENOUS | Status: DC | PRN
  Filled 2013-08-03: qty 2

## 2013-08-03 MED ORDER — OXYCODONE-ACETAMINOPHEN 5-325 MG PO TABS: 1.0000 | ORAL_TABLET | ORAL | Status: DC | PRN

## 2013-08-03 MED ORDER — DIPHENHYDRAMINE HCL 25 MG PO CAPS
25.0000 mg | ORAL_CAPSULE | Freq: Four times a day (QID) | ORAL | Status: DC | PRN
  Administered 2013-08-03: 25 mg via ORAL
  Filled 2013-08-03: qty 1

## 2013-08-03 MED ORDER — DIPHENHYDRAMINE HCL 50 MG/ML IJ SOLN: 12.5000 mg | INTRAMUSCULAR | Status: DC | PRN

## 2013-08-03 MED ORDER — LACTATED RINGERS IV SOLN
500.0000 mL | INTRAVENOUS | Status: DC | PRN
  Administered 2013-08-03: 500 mL via INTRAVENOUS

## 2013-08-03 MED ORDER — PENICILLIN G POTASSIUM 5000000 UNITS IJ SOLR
5.0000 10*6.[IU] | Freq: Once | INTRAVENOUS | Status: AC
  Administered 2013-08-03: 5 10*6.[IU] via INTRAVENOUS
  Filled 2013-08-03: qty 5

## 2013-08-03 MED ORDER — PHENYLEPHRINE 40 MCG/ML (10ML) SYRINGE FOR IV PUSH (FOR BLOOD PRESSURE SUPPORT)
80.0000 ug | PREFILLED_SYRINGE | INTRAVENOUS | Status: DC | PRN
  Filled 2013-08-03: qty 2
  Filled 2013-08-03: qty 10

## 2013-08-03 MED ORDER — CITRIC ACID-SODIUM CITRATE 334-500 MG/5ML PO SOLN: 30.0000 mL | ORAL | Status: DC | PRN

## 2013-08-03 MED ORDER — ONDANSETRON HCL 4 MG PO TABS: 4.0000 mg | ORAL_TABLET | ORAL | Status: DC | PRN

## 2013-08-03 MED ORDER — BENZOCAINE-MENTHOL 20-0.5 % EX AERO
1.0000 "application " | INHALATION_SPRAY | CUTANEOUS | Status: DC | PRN
  Administered 2013-08-03: 1 via TOPICAL
  Filled 2013-08-03: qty 56

## 2013-08-03 MED ORDER — CYCLOBENZAPRINE HCL 5 MG PO TABS
5.0000 mg | ORAL_TABLET | Freq: Once | ORAL | Status: AC
  Administered 2013-08-03: 5 mg via ORAL
  Filled 2013-08-03: qty 1

## 2013-08-03 MED ORDER — LANOLIN HYDROUS EX OINT: TOPICAL_OINTMENT | CUTANEOUS | Status: DC | PRN

## 2013-08-03 MED ORDER — FLEET ENEMA 7-19 GM/118ML RE ENEM: 1.0000 | ENEMA | Freq: Every day | RECTAL | Status: DC | PRN

## 2013-08-03 MED ORDER — IBUPROFEN 600 MG PO TABS
600.0000 mg | ORAL_TABLET | Freq: Four times a day (QID) | ORAL | Status: DC | PRN
  Administered 2013-08-03: 600 mg via ORAL
  Filled 2013-08-03: qty 1

## 2013-08-03 MED ORDER — OXYTOCIN BOLUS FROM INFUSION
500.0000 mL | INTRAVENOUS | Status: DC
  Administered 2013-08-03: 500 mL via INTRAVENOUS

## 2013-08-03 MED ORDER — OXYCODONE-ACETAMINOPHEN 5-325 MG PO TABS
1.0000 | ORAL_TABLET | ORAL | Status: DC | PRN
  Administered 2013-08-04 (×2): 1 via ORAL
  Administered 2013-08-04 – 2013-08-05 (×3): 2 via ORAL
  Filled 2013-08-03 (×2): qty 2
  Filled 2013-08-03 (×2): qty 1
  Filled 2013-08-03: qty 2

## 2013-08-03 MED ORDER — ACETAMINOPHEN 325 MG PO TABS: 650.0000 mg | ORAL_TABLET | ORAL | Status: DC | PRN

## 2013-08-03 MED ORDER — FENTANYL 2.5 MCG/ML BUPIVACAINE 1/10 % EPIDURAL INFUSION (WH - ANES)
INTRAMUSCULAR | Status: DC | PRN
  Administered 2013-08-03: 14 mL/h via EPIDURAL

## 2013-08-03 MED ORDER — LIDOCAINE HCL (PF) 1 % IJ SOLN
30.0000 mL | INTRAMUSCULAR | Status: DC | PRN
  Filled 2013-08-03 (×2): qty 30

## 2013-08-03 MED ORDER — ALBUTEROL SULFATE (2.5 MG/3ML) 0.083% IN NEBU: 3.0000 mL | INHALATION_SOLUTION | Freq: Four times a day (QID) | RESPIRATORY_TRACT | Status: DC | PRN

## 2013-08-03 NOTE — Anesthesia Procedure Notes (Signed)
Epidural Patient location during procedure: OB Start time: 08/03/2013 3:10 PM  Staffing Anesthesiologist: Evania Lyne A. Performed by: anesthesiologist   Preanesthetic Checklist Completed: patient identified, site marked, surgical consent, pre-op evaluation, timeout performed, IV checked, risks and benefits discussed and monitors and equipment checked  Epidural Patient position: sitting Prep: site prepped and draped and DuraPrep Patient monitoring: continuous pulse ox and blood pressure Approach: midline Injection technique: LOR air  Needle:  Needle type: Tuohy  Needle gauge: 17 G Needle length: 9 cm and 9 Needle insertion depth: 5 cm cm Catheter type: closed end flexible Catheter size: 19 Gauge Catheter at skin depth: 10 cm Test dose: negative and Other  Assessment Events: blood not aspirated, injection not painful, no injection resistance, negative IV test and no paresthesia  Additional Notes Patient identified. Risks and benefits discussed including failed block, incomplete  Pain control, post dural puncture headache, nerve damage, paralysis, blood pressure Changes, nausea, vomiting, reactions to medications-both toxic and allergic and post Partum back pain. All questions were answered. Patient expressed understanding and wished to proceed. Sterile technique was used throughout procedure. Epidural site was Dressed with sterile barrier dressing. No paresthesias, signs of intravascular injection Or signs of intrathecal spread were encountered.  Patient was more comfortable after the epidural was dosed. Please see RN's note for documentation of vital signs and FHR which are stable.

## 2013-08-03 NOTE — Anesthesia Preprocedure Evaluation (Signed)
Anesthesia Evaluation    Airway Mallampati: III TM Distance: >3 FB Neck ROM: Full    Dental no notable dental hx. (+) Teeth Intact   Pulmonary asthma , former smoker,  breath sounds clear to auscultation  Pulmonary exam normal       Cardiovascular Rhythm:Regular Rate:Normal     Neuro/Psych PSYCHIATRIC DISORDERS Anxiety Depression Bipolar Disorder ADHD   GI/Hepatic negative GI ROS, GERD-  Medicated and Controlled,(+)     substance abuse  alcohol use and marijuana use,   Endo/Other  Morbid obesityObesity  Renal/GU negative Renal ROS     Musculoskeletal negative musculoskeletal ROS (+)   Abdominal (+) + obese,   Peds  Hematology negative hematology ROS (+)   Anesthesia Other Findings   Reproductive/Obstetrics (+) Pregnancy HSV                           Anesthesia Physical Anesthesia Plan  ASA: III  Anesthesia Plan: Epidural   Post-op Pain Management:    Induction:   Airway Management Planned: Natural Airway  Additional Equipment:   Intra-op Plan:   Post-operative Plan:   Informed Consent: I have reviewed the patients History and Physical, chart, labs and discussed the procedure including the risks, benefits and alternatives for the proposed anesthesia with the patient or authorized representative who has indicated his/her understanding and acceptance.     Plan Discussed with: Anesthesiologist  Anesthesia Plan Comments:         Anesthesia Quick Evaluation
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
## 2013-08-03 NOTE — Progress Notes (Addendum)
   Subjective: Pt reports feeling comfortable with epidural.   Objective: BP 101/59  Pulse 90  Temp(Src) 98.3 F (36.8 C) (Oral)  Resp 18  Ht 5\' 5"  (1.651 m)  Wt 96.616 kg (213 lb)  BMI 35.44 kg/m2  SpO2 99%  LMP 11/13/2012      FHT:  FHR: 130's bpm, variability: moderate,  accelerations:  Present,  decelerations:  Absent UC:   irregular, every 2.5-3 minutes SVE:   Dilation: 4 Effacement (%): 50 Station: -3 Exam by:: Roney MarionW. Muhammad, CNM  Labs: Lab Results  Component Value Date   WBC 8.7 08/03/2013   HGB 11.2* 08/03/2013   HCT 32.7* 08/03/2013   MCV 83.8 08/03/2013   PLT 265 08/03/2013   AROM for clear fluid  Assessment / Plan: Augmentation of labor, progressing well  Labor: Progressing normally Preeclampsia:  n/a Fetal Wellbeing:  Category I Pain Control:  Epidural I/D:  GBS pos Anticipated MOD:  NSVD  [facility] Maryland[facility]UHAMMAD,WALIDAH 08/03/2013, 4:34 PM

## 2013-08-03 NOTE — MAU Note (Signed)
Pt c/o uc since 10pm. Denies LOF and vag bleeding. States some decreased FM for several hours now.

## 2013-08-03 NOTE — Progress Notes (Signed)
   Subjective: Pt reports pain 4/10, "not hurting much".  Back pain has improved.  Objective: BP 91/49  Pulse 82  Temp(Src) 97.8 F (36.6 C) (Oral)  Resp 18  Ht 5\' 5"  (1.651 m)  Wt 96.616 kg (213 lb)  BMI 35.44 kg/m2  LMP 11/13/2012      FHT:  FHR: 130's bpm, variability: moderate,  accelerations:  Present,  decelerations:  Absent UC:   irregular, every 3-6 minutes SVE:   Dilation: 4 Effacement (%): 50 Station: -3 Exam by:: Roney MarionW. Muhammad, CNM  Labs: Lab Results  Component Value Date   WBC 8.7 08/03/2013   HGB 11.2* 08/03/2013   HCT 32.7* 08/03/2013   MCV 83.8 08/03/2013   PLT 265 08/03/2013    Assessment / Plan: Induction of labor due to postterm,  progressing well on pitocin  Labor: Progressing normally Preeclampsia:  n/a Fetal Wellbeing:  Category I Pain Control:  Labor support without medications I/D:  GBS pos Anticipated MOD:  NSVD  [facility] LPMUHAMMAD,Tashana Haberl 08/03/2013, 1:58 PM

## 2013-08-03 NOTE — Progress Notes (Signed)
   Subjective: Pt reports comfortable with epidural.  Consents to AROM. Denies headache, vision changes, or epigastric pain.  Objective: BP 103/68  Pulse 74  Temp(Src) 98.3 F (36.8 C) (Oral)  Resp 18  Ht 5\' 5"  (1.651 m)  Wt 96.616 kg (213 lb)  BMI 35.44 kg/m2  SpO2 99%  LMP 11/13/2012      FHT:  FHR: 130's bpm, variability: moderate,  accelerations:  Present,  decelerations:  Absent UC:   Poor tracing SVE:   Dilation: 5 Effacement (%): 50 Station: -2 Exam by:: Roney MarionW. Muhammad, CNM  AROM > clear fluid  Labs: Lab Results  Component Value Date   WBC 8.7 08/03/2013   HGB 11.2* 08/03/2013   HCT 32.7* 08/03/2013   MCV 83.8 08/03/2013   PLT 265 08/03/2013    Assessment / Plan: Augmentation of Labor  Labor: Augmentation of Labor Preeclampsia:  No signs and symptoms Fetal Wellbeing:  Category I Pain Control:  Epidural I/D:  GBS pos Anticipated MOD:  NSVD  MUHAMMAD,WALIDAH 08/03/2013, 5:09 PM

## 2013-08-03 NOTE — H&P (Signed)
Attestation of Attending Supervision of Advanced Practitioner (CNM/NP): Evaluation and management procedures were performed by the Advanced Practitioner under my supervision and collaboration.  I have reviewed the Advanced Practitioner's note and chart, and I agree with the management and plan.  Imir Brumbach 08/03/2013 1:15 PM
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
## 2013-08-03 NOTE — Discharge Instructions (Signed)
Labor Induction  Labor induction is when steps are taken to cause a pregnant woman to begin the labor process. Most women go into labor on their own between 37 weeks and 42 weeks of the pregnancy. When this does not happen or when there is a medical need, methods may be used to induce labor. Labor induction causes a pregnant woman's uterus to contract. It also causes the cervix to soften (ripen), open (dilate), and thin out (efface). Usually, labor is not induced before 39 weeks of the pregnancy unless there is a problem with the baby or mother.  Before inducing labor, your health care provider will consider a number of factors, including the following:  The medical condition of you and the baby.   How many weeks along you are.   The status of the baby's lung maturity.   The condition of the cervix.   The position of the baby.  WHAT ARE THE REASONS FOR LABOR INDUCTION? Labor may be induced for the following reasons:  The health of the baby or mother is at risk.   The pregnancy is overdue by 1 week or more.   The water breaks but labor does not start on its own.   The mother has a health condition or serious illness, such as high blood pressure, infection, placental abruption, or diabetes.  The amniotic fluid amounts are low around the baby.   The baby is distressed.  Convenience or wanting the baby to be born on a certain date is not a reason for inducing labor. WHAT METHODS ARE USED FOR LABOR INDUCTION? Several methods of labor induction may be used, such as:   Prostaglandin medicine. This medicine causes the cervix to dilate and ripen. The medicine will also start contractions. It can be taken by mouth or by inserting a suppository into the vagina.   Inserting a thin tube (catheter) with a balloon on the end into the vagina to dilate the cervix. Once inserted, the balloon is expanded with water, which causes the cervix to open.   Stripping the membranes. Your health  care provider separates amniotic sac tissue from the cervix, causing the cervix to be stretched and causing the release of a hormone called progesterone. This may cause the uterus to contract. It is often done during an office visit. You will be sent home to wait for the contractions to begin. You will then come in for an induction.   Breaking the water. Your health care provider makes a hole in the amniotic sac using a small instrument. Once the amniotic sac breaks, contractions should begin. This may still take hours to see an effect.   Medicine to trigger or strengthen contractions. This medicine is given through an IV access tube inserted into a vein in your arm.  All of the methods of induction, besides stripping the membranes, will be done in the hospital. Induction is done in the hospital so that you and the baby can be carefully monitored.  HOW LONG DOES IT TAKE FOR LABOR TO BE INDUCED? Some inductions can take up to 2 3 days. Depending on the cervix, it usually takes less time. It takes longer when you are induced early in the pregnancy or if this is your first pregnancy. If a mother is still pregnant and the induction has been going on for 2 3 days, either the mother will be sent home or a cesarean delivery will be needed. WHAT ARE THE RISKS ASSOCIATED WITH LABOR INDUCTION? Some of the risks   of induction include:   Changes in fetal heart rate, such as too high, too low, or erratic.   Fetal distress.   Chance of infection for the mother and baby.   Increased chance of having a cesarean delivery.   Breaking off (abruption) of the placenta from the uterus (rare).   Uterine rupture (very rare).  When induction is needed for medical reasons, the benefits of induction may outweigh the risks. WHAT ARE SOME REASONS FOR NOT INDUCING LABOR? Labor induction should not be done if:   It is shown that your baby does not tolerate labor.   You have had previous surgeries on your  uterus, such as a myomectomy or the removal of fibroids.   Your placenta lies very low in the uterus and blocks the opening of the cervix (placenta previa).   Your baby is not in a head-down position.   The umbilical cord drops down into the birth canal in front of the baby. This could cut off the baby's blood and oxygen supply.   You have had a previous cesarean delivery.   There are unusual circumstances, such as the baby being extremely premature.  Document Released: 11/19/2006 Document Revised: 03/02/2013 Document Reviewed: 01/27/2013 [facility] Patient Information 2014 [place], [place].  Braxton Hicks Contractions Pregnancy is commonly associated with contractions of the uterus throughout the pregnancy. Towards the end of pregnancy (32 to 34 weeks), these contractions [facility] Willa Rough) can develop more often and may become more forceful. This is not true labor because these contractions do not result in opening (dilatation) and thinning of the cervix. They are sometimes difficult to tell apart from true labor because these contractions can be forceful and people have different pain tolerances. You should not feel embarrassed if you go to the hospital with false labor. Sometimes, the only way to tell if you are in true labor is for your caregiver to follow the changes in the cervix. How to tell the difference between true and false labor:  False labor.  The contractions of false labor are usually shorter, irregular and not as hard as those of true labor.  They are often felt in the front of the lower abdomen and in the groin.  They may leave with walking around or changing positions while lying down.  They get weaker and are shorter lasting as time goes on.  These contractions are usually irregular.  They do not usually become progressively stronger, regular and closer together as with true labor.  True labor.  Contractions in true labor last 30 to 70 seconds, become very  regular, usually become more intense, and increase in frequency.  They do not go away with walking.  The discomfort is usually felt in the top of the uterus and spreads to the lower abdomen and low back.  True labor can be determined by your caregiver with an exam. This will show that the cervix is dilating and getting thinner. If there are no prenatal problems or other health problems associated with the pregnancy, it is completely safe to be sent home with false labor and await the onset of true labor. HOME CARE INSTRUCTIONS   Keep up with your usual exercises and instructions.  Take medications as directed.  Keep your regular prenatal appointment.  Eat and drink lightly if you think you are going into labor.  If BH contractions are making you uncomfortable:  Change your activity position from lying down or resting to walking/walking to resting.  Sit and rest in a  tub of warm water.  Drink 2 to 3 glasses of water. Dehydration may cause B-H contractions.  Do slow and deep breathing several times an hour. SEEK IMMEDIATE MEDICAL CARE IF:   Your contractions continue to become stronger, more regular, and closer together.  You have a gushing, burst or leaking of fluid from the vagina.  An oral temperature above 102 F (38.9 C) develops.  You have passage of blood-tinged mucus.  You develop vaginal bleeding.  You develop continuous belly (abdominal) pain.  You have low back pain that you never had before.  You feel the baby's head pushing down causing pelvic pressure.  The baby is not moving as much as it used to. Document Released: 06/30/2005 Document Revised: 09/22/2011 Document Reviewed: 04/11/2013 [facility] Patient Information 2014 [place], [place].  Fetal Movement Counts Patient Name: __________________________________________________ Patient Due Date: ____________________ Performing a fetal movement count is highly recommended in high-risk pregnancies, but it is  good for every pregnant woman to do. Your caregiver may ask you to start counting fetal movements at 28 weeks of the pregnancy. Fetal movements often increase:  After eating a full meal.  After physical activity.  After eating or drinking something sweet or cold.  At rest. Pay attention to when you feel the baby is most active. This will help you notice a pattern of your baby's sleep and wake cycles and what factors contribute to an increase in fetal movement. It is important to perform a fetal movement count at the same time each day when your baby is normally most active.  HOW TO COUNT FETAL MOVEMENTS 1. Find a quiet and comfortable area to sit or lie down on your left side. Lying on your left side provides the best blood and oxygen circulation to your baby. 2. Write down the day and time on a sheet of paper or in a journal. 3. Start counting kicks, flutters, swishes, rolls, or jabs in a 2 hour period. You should feel at least 10 movements within 2 hours. 4. If you do not feel 10 movements in 2 hours, wait 2 3 hours and count again. Look for a change in the pattern or not enough counts in 2 hours. SEEK MEDICAL CARE IF:  You feel less than 10 counts in 2 hours, tried twice.  There is no movement in over an hour.  The pattern is changing or taking longer each day to reach 10 counts in 2 hours.  You feel the baby is not moving as he or she usually does. Date: ____________ Movements: ____________ Start time: ____________ Doreatha Martin time: ____________  Date: ____________ Movements: ____________ Start time: ____________ Doreatha Martin time: ____________ Date: ____________ Movements: ____________ Start time: ____________ Doreatha Martin time: ____________ Date: ____________ Movements: ____________ Start time: ____________ Doreatha Martin time: ____________ Date: ____________ Movements: ____________ Start time: ____________ Doreatha Martin time: ____________ Date: ____________ Movements: ____________ Start time: ____________ Doreatha Martin  time: ____________ Date: ____________ Movements: ____________ Start time: ____________ Doreatha Martin time: ____________ Date: ____________ Movements: ____________ Start time: ____________ Doreatha Martin time: ____________  Date: ____________ Movements: ____________ Start time: ____________ Doreatha Martin time: ____________ Date: ____________ Movements: ____________ Start time: ____________ Doreatha Martin time: ____________ Date: ____________ Movements: ____________ Start time: ____________ Doreatha Martin time: ____________ Date: ____________ Movements: ____________ Start time: ____________ Doreatha Martin time: ____________ Date: ____________ Movements: ____________ Start time: ____________ Doreatha Martin time: ____________ Date: ____________ Movements: ____________ Start time: ____________ Doreatha Martin time: ____________ Date: ____________ Movements: ____________ Start time: ____________ Doreatha Martin time: ____________  Date: ____________ Movements: ____________ Start time: ____________ Doreatha Martin time: ____________ Date: ____________ Movements:  ____________ Start time: ____________ Doreatha MartinFinish time: ____________ Date: ____________ Movements: ____________ Start time: ____________ Doreatha MartinFinish time: ____________ Date: ____________ Movements: ____________ Start time: ____________ Doreatha MartinFinish time: ____________ Date: ____________ Movements: ____________ Start time: ____________ Doreatha MartinFinish time: ____________ Date: ____________ Movements: ____________ Start time: ____________ Doreatha MartinFinish time: ____________ Date: ____________ Movements: ____________ Start time: ____________ Doreatha MartinFinish time: ____________  Date: ____________ Movements: ____________ Start time: ____________ Doreatha MartinFinish time: ____________ Date: ____________ Movements: ____________ Start time: ____________ Doreatha MartinFinish time: ____________ Date: ____________ Movements: ____________ Start time: ____________ Doreatha MartinFinish time: ____________ Date: ____________ Movements: ____________ Start time: ____________ Doreatha MartinFinish time: ____________ Date: ____________  Movements: ____________ Start time: ____________ Doreatha MartinFinish time: ____________ Date: ____________ Movements: ____________ Start time: ____________ Doreatha MartinFinish time: ____________ Date: ____________ Movements: ____________ Start time: ____________ Doreatha MartinFinish time: ____________  Date: ____________ Movements: ____________ Start time: ____________ Doreatha MartinFinish time: ____________ Date: ____________ Movements: ____________ Start time: ____________ Doreatha MartinFinish time: ____________ Date: ____________ Movements: ____________ Start time: ____________ Doreatha MartinFinish time: ____________ Date: ____________ Movements: ____________ Start time: ____________ Doreatha MartinFinish time: ____________ Date: ____________ Movements: ____________ Start time: ____________ Doreatha MartinFinish time: ____________ Date: ____________ Movements: ____________ Start time: ____________ Doreatha MartinFinish time: ____________ Date: ____________ Movements: ____________ Start time: ____________ Doreatha MartinFinish time: ____________  Date: ____________ Movements: ____________ Start time: ____________ Doreatha MartinFinish time: ____________ Date: ____________ Movements: ____________ Start time: ____________ Doreatha MartinFinish time: ____________ Date: ____________ Movements: ____________ Start time: ____________ Doreatha MartinFinish time: ____________ Date: ____________ Movements: ____________ Start time: ____________ Doreatha MartinFinish time: ____________ Date: ____________ Movements: ____________ Start time: ____________ Doreatha MartinFinish time: ____________ Date: ____________ Movements: ____________ Start time: ____________ Doreatha MartinFinish time: ____________ Date: ____________ Movements: ____________ Start time: ____________ Doreatha MartinFinish time: ____________  Date: ____________ Movements: ____________ Start time: ____________ Doreatha MartinFinish time: ____________ Date: ____________ Movements: ____________ Start time: ____________ Doreatha MartinFinish time: ____________ Date: ____________ Movements: ____________ Start time: ____________ Doreatha MartinFinish time: ____________ Date: ____________ Movements: ____________ Start time:  ____________ Doreatha MartinFinish time: ____________ Date: ____________ Movements: ____________ Start time: ____________ Doreatha MartinFinish time: ____________ Date: ____________ Movements: ____________ Start time: ____________ Doreatha MartinFinish time: ____________ Date: ____________ Movements: ____________ Start time: ____________ Doreatha MartinFinish time: ____________  Date: ____________ Movements: ____________ Start time: ____________ Doreatha MartinFinish time: ____________ Date: ____________ Movements: ____________ Start time: ____________ Doreatha MartinFinish time: ____________ Date: ____________ Movements: ____________ Start time: ____________ Doreatha MartinFinish time: ____________ Date: ____________ Movements: ____________ Start time: ____________ Doreatha MartinFinish time: ____________ Date: ____________ Movements: ____________ Start time: ____________ Doreatha MartinFinish time: ____________ Date: ____________ Movements: ____________ Start time: ____________ Doreatha MartinFinish time: ____________ Document Released: 07/30/2006 Document Revised: 06/16/2012 Document Reviewed: 04/26/2012 [employer] Patient Information 2014 [employer].

## 2013-08-04 ENCOUNTER — Encounter (HOSPITAL_COMMUNITY)
Admission: AD | Disposition: A | Discharge status: Home / Self Care | Payer: Self-pay | Source: Ambulatory Visit | Attending: Obstetrics & Gynecology

## 2013-08-04 ENCOUNTER — Inpatient Hospital Stay (HOSPITAL_COMMUNITY): Payer: Medicare Other

## 2013-08-04 ENCOUNTER — Encounter (HOSPITAL_COMMUNITY): Payer: Medicare Other

## 2013-08-04 ENCOUNTER — Encounter (HOSPITAL_COMMUNITY): Payer: Self-pay

## 2013-08-04 DIAGNOSIS — Z302 Encounter for sterilization: Secondary | ICD-10-CM

## 2013-08-04 HISTORY — PX: TUBAL LIGATION: SHX77

## 2013-08-04 LAB — SURGICAL PCR SCREEN
MRSA, PCR: NEGATIVE
Staphylococcus aureus: NEGATIVE

## 2013-08-04 SURGERY — LIGATION, FALLOPIAN TUBE, POSTPARTUM
Anesthesia: Epidural | Site: Abdomen | Laterality: Bilateral

## 2013-08-04 MED ORDER — BUPIVACAINE HCL (PF) 0.25 % IJ SOLN
INTRAMUSCULAR | Status: DC | PRN
  Administered 2013-08-04: 10 mL

## 2013-08-04 MED ORDER — MIDAZOLAM HCL 5 MG/5ML IJ SOLN: INTRAMUSCULAR | Status: DC | PRN

## 2013-08-04 MED ORDER — SODIUM BICARBONATE 8.4 % IV SOLN
INTRAVENOUS | Status: AC
  Filled 2013-08-04: qty 50

## 2013-08-04 MED ORDER — FAMOTIDINE 20 MG PO TABS
40.0000 mg | ORAL_TABLET | Freq: Once | ORAL | Status: AC
  Administered 2013-08-04: 40 mg via ORAL
  Filled 2013-08-04: qty 2

## 2013-08-04 MED ORDER — LIDOCAINE-EPINEPHRINE (PF) 2 %-1:200000 IJ SOLN
INTRAMUSCULAR | Status: AC
  Filled 2013-08-04: qty 20

## 2013-08-04 MED ORDER — MIDAZOLAM HCL 2 MG/2ML IJ SOLN
INTRAMUSCULAR | Status: AC
  Filled 2013-08-04: qty 2

## 2013-08-04 MED ORDER — LACTATED RINGERS IV SOLN
INTRAVENOUS | Status: DC
  Administered 2013-08-04: 125 mL/h via INTRAVENOUS
  Administered 2013-08-04: 16:00:00 via INTRAVENOUS

## 2013-08-04 MED ORDER — 0.9 % SODIUM CHLORIDE (POUR BTL) OPTIME
TOPICAL | Status: DC | PRN
  Administered 2013-08-04: 1000 mL

## 2013-08-04 MED ORDER — METOCLOPRAMIDE HCL 10 MG PO TABS
10.0000 mg | ORAL_TABLET | Freq: Once | ORAL | Status: AC
Start: 2013-08-04 — End: 2013-08-04
  Administered 2013-08-04: 10 mg via ORAL
  Filled 2013-08-04: qty 1

## 2013-08-04 MED ORDER — LACTATED RINGERS IV SOLN: INTRAVENOUS | Status: DC

## 2013-08-04 MED ORDER — MIDAZOLAM HCL 2 MG/2ML IJ SOLN
INTRAMUSCULAR | Status: DC | PRN
  Administered 2013-08-04: 2 mg via INTRAVENOUS
  Administered 2013-08-04: 1 mg via INTRAVENOUS

## 2013-08-04 MED ORDER — MEPERIDINE HCL 25 MG/ML IJ SOLN: 6.2500 mg | INTRAMUSCULAR | Status: DC | PRN

## 2013-08-04 MED ORDER — METOCLOPRAMIDE HCL 5 MG/ML IJ SOLN: 10.0000 mg | Freq: Once | INTRAMUSCULAR | Status: AC | PRN

## 2013-08-04 MED ORDER — FENTANYL CITRATE 0.05 MG/ML IJ SOLN: 25.0000 ug | INTRAMUSCULAR | Status: DC | PRN

## 2013-08-04 MED ORDER — KETOROLAC TROMETHAMINE 30 MG/ML IJ SOLN
INTRAMUSCULAR | Status: DC | PRN
  Administered 2013-08-04: 30 mg via INTRAVENOUS

## 2013-08-04 MED ORDER — SODIUM BICARBONATE 8.4 % IV SOLN
INTRAVENOUS | Status: DC | PRN
  Administered 2013-08-04: 3 mL via EPIDURAL
  Administered 2013-08-04 (×3): 5 mL via EPIDURAL
  Administered 2013-08-04: 2 mL via EPIDURAL

## 2013-08-04 MED ORDER — FENTANYL CITRATE 0.05 MG/ML IJ SOLN
INTRAMUSCULAR | Status: DC | PRN
  Administered 2013-08-04 (×2): 50 ug via INTRAVENOUS

## 2013-08-04 MED ORDER — ONDANSETRON HCL 4 MG/2ML IJ SOLN
INTRAMUSCULAR | Status: DC | PRN
  Administered 2013-08-04: 4 mg via INTRAVENOUS

## 2013-08-04 MED ORDER — FENTANYL CITRATE 0.05 MG/ML IJ SOLN
INTRAMUSCULAR | Status: AC
  Filled 2013-08-04: qty 2

## 2013-08-04 MED ORDER — BUPIVACAINE HCL (PF) 0.25 % IJ SOLN
INTRAMUSCULAR | Status: AC
  Filled 2013-08-04: qty 30

## 2013-08-04 SURGICAL SUPPLY — 20 items
CLIP FILSHIE TUBAL LIGA STRL (Clip) ×3 IMPLANT
CLOTH BEACON ORANGE TIMEOUT ST (SAFETY) ×3 IMPLANT
DERMABOND ADVANCED (GAUZE/BANDAGES/DRESSINGS) ×2
DERMABOND ADVANCED .7 DNX12 (GAUZE/BANDAGES/DRESSINGS) ×1 IMPLANT
DURAPREP 26ML APPLICATOR (WOUND CARE) ×3 IMPLANT
GLOVE BIO SURGEON STRL SZ7 (GLOVE) ×3 IMPLANT
GLOVE BIOGEL PI IND STRL 7.0 (GLOVE) ×1 IMPLANT
GLOVE BIOGEL PI INDICATOR 7.0 (GLOVE) ×2
GOWN PREVENTION PLUS LG XLONG (DISPOSABLE) ×6 IMPLANT
NEEDLE HYPO 22GX1.5 SAFETY (NEEDLE) ×3 IMPLANT
NS IRRIG 1000ML POUR BTL (IV SOLUTION) ×3 IMPLANT
PACK ABDOMINAL MINOR (CUSTOM PROCEDURE TRAY) ×3 IMPLANT
SPONGE LAP 4X18 X RAY DECT (DISPOSABLE) ×3 IMPLANT
SUT VIC AB 0 CT1 27 (SUTURE) ×2
SUT VIC AB 0 CT1 27XBRD ANBCTR (SUTURE) ×1 IMPLANT
SUT VICRYL 4-0 PS2 18IN ABS (SUTURE) ×3 IMPLANT
SYR CONTROL 10ML LL (SYRINGE) ×3 IMPLANT
TOWEL OR 17X24 6PK STRL BLUE (TOWEL DISPOSABLE) ×6 IMPLANT
TRAY FOLEY CATH 14FR (SET/KITS/TRAYS/PACK) ×3 IMPLANT
WATER STERILE IRR 1000ML POUR (IV SOLUTION) ×3 IMPLANT

## 2013-08-04 NOTE — Transfer of Care (Signed)
Immediate Anesthesia Transfer of Care Note  Patient: Karen Lester  Procedure(s) Performed: Procedure(s): POST PARTUM TUBAL LIGATION (Bilateral)  Patient Location: PACU  Anesthesia Type:Epidural  Level of Consciousness: awake, alert , oriented and patient cooperative  Airway & Oxygen Therapy: Patient Spontanous Breathing  Post-op Assessment: Report given to PACU RN and Post -op Vital signs reviewed and stable  Post vital signs: Reviewed and stable  Complications: No apparent anesthesia complications

## 2013-08-04 NOTE — Anesthesia Postprocedure Evaluation (Signed)
  Anesthesia Post-op Note  Patient: Karen Lester  Procedure(s) Performed: Procedure(s): POST PARTUM TUBAL LIGATION (Bilateral)  Patient is awake, responsive, moving her legs, and has signs of resolution of her numbness. Pain and nausea are reasonably well controlled. Vital signs are stable and clinically acceptable. Oxygen saturation is clinically acceptable. There are no apparent anesthetic complications at this time. Patient is ready for discharge.

## 2013-08-04 NOTE — Op Note (Signed)
Karen Lester 08/03/2013 - 08/04/2013  PREOPERATIVE DIAGNOSIS:  Multiparity, undesired fertility  POSTOPERATIVE DIAGNOSIS:  Multiparity, undesired fertility  PROCEDURE:  Postpartum Bilateral Tubal Sterilization using Filshie Clips   SURGEON: Dr.  Elsie LincolnKelly Leggett, Dr. Rulon AbideKeli Adamari Frede  ANESTHESIA:  Epidural and local analgesia using 0.5% Marcaine  COMPLICATIONS:  None immediate.  ESTIMATED BLOOD LOSS: 5 ml.   INDICATIONS: 28 y.o. [id number]  with undesired fertility,status post vaginal delivery, desires permanent sterilization.  Other reversible forms of contraception were discussed with patient; she declines all other modalities. Risks of procedure discussed with patient including but not limited to: risk of regret, permanence of method, bleeding, infection, injury to surrounding organs and need for additional procedures.  Failure risk of 0.5-1% with increased risk of ectopic gestation if pregnancy occurs was also discussed with patient.     FINDINGS:  Normal uterus, tubes, and ovaries.  PROCEDURE DETAILS: The patient was taken to the operating room where her epidural anesthesia was dosed up to surgical level and found to be adequate.  She was then placed in the dorsal supine position and prepped and draped in sterile fashion.  After an adequate timeout was performed, attention was turned to the patient's abdomen where a small transverse skin incision was made under the umbilical fold. The incision was taken down to the layer of fascia using the scalpel, and fascia was incised, and extended bilaterally using Mayo scissors. The peritoneum was entered in a sharp fashion. Attention was then turned to the patient's uterus, and left fallopian tube was identified and followed out to the fimbriated end.  A Filshie clip was placed on the left fallopian tube about 3 cm from the cornual attachment, with care given to incorporate the underlying mesosalpinx.  A similar process was carried out on the right side  allowing for bilateral tubal sterilization.  Good hemostasis was noted overall.  Local analgesia was injected into both Filshie application sites.The instruments were then removed from the patient's abdomen and the fascial incision was repaired with 0 Vicryl, and the skin was closed with a 4-0 Vicryl subcuticular stitch. The patient tolerated the procedure well.  Instrument, sponge, and needle counts were correct times two.  The patient was then taken to the recovery room awake and in stable condition.

## 2013-08-04 NOTE — Clinical Social Work Maternal (Addendum)
Clinical Social Work Department PSYCHOSOCIAL ASSESSMENT - MATERNAL/CHILD 08/04/2013  Patient:  Karen, Lester  Account Number:  [id number]  [place] Date:  08/03/2013  Ardine Eng Name:   Karen Lester (last name undecided)    Clinical Social Worker:  Gerri Spore, LCSW   Date/Time:  08/04/2013 02:16 PM  Date Referred:  08/04/2013   Referral source  CN     Referred reason  Behavioral Health Issues  Substance Abuse   Other referral source:    I:  FAMILY / HOME ENVIRONMENT Child's legal guardian:  PARENT  Guardian - Name Guardian - Age Guardian - Address  Karen Lester [address]; [place], [place] [address]  Karen Lester, Karen Lester [address] [place], [place]   Other household support members/support persons Name Relationship DOB  Itmann    SON 32 years old   SON 73 years old   DAUGHTER 25 years old   Other support:    II  PSYCHOSOCIAL DATA Information Source:  Patient Interview  Insurance risk surveyor Resources Employment:   SSI- ADHD, Bipolar disorder & Learning disability, per pt.   Financial resources:   If Medicaid - County:    School / Grade:   Maternity Care Coordinator / Child Services Coordination / Early Interventions:  Cultural issues impacting care:    Karen Lester  STRENGTHS Strengths  Adequate Resources  Home prepared for Child (including basic supplies)  Supportive family/friends   Strength comment:    IV  RISK FACTORS AND CURRENT PROBLEMS Current Problem:  YES   Risk Factor & Current Problem Patient Issue Family Issue Risk Factor / Current Problem Comment  Mental Illness Y N Hx ADHD, Bipolar, Anxiety  Substance Abuse Y N Hx of MJ    V  SOCIAL WORK ASSESSMENT CSW met with 28 year old, G6P5 to assess her current social & offer resources if needed.  Pt lives with her mother & her 3 younger children, ages 49, 62 & 68.  Her 27 year old son lives with her sister in [place].  Pt was living in [place], [place] with FOB when she received pregnancy confirmation.   She started [facility] at 9 weeks.  She reports that she received regular PNC until she moved back to the area in Oct. '14 at 6 1/2-7 months.  Pt decided to move back to the area to be closer to her mother/supportive.  According to pt, she established [facility] quickly & didn't have any lapse in [facility].  Pt has dealt with mental illnesses since childhood.  She was diagnosed with ADHD at 28 years old & has taken Concerta consistently since then.  The pt was diagnosed with bipolar disorder at 28 years old by a physician in [place].  Her symptoms have been treated with medication off & on since then.  In February '13, pt established mental health treatment at [facility].  There, she was prescribed Depakote, of which she did not like.  Pt told CSW that the medication made her heart hurt & did not like the side effects.  She stopped seeking treatment at the [facility] established care with [facility] in June '13. She was prescribed Celexa, which she thinks is working well for her.  She is seen by an interdisciplinary team (psychiatrist, therapist...etc), once a month.  Upon discharge pt plans restart medication.  CSW encouraged to follow up with mental health services & discussed her increased risk of experiencing PP depression symptoms.  Pt appears  receptive to information discussed & motivated to continue with treatment.  CSW asked pt how often she smoked MJ prior to pregnancy confirmation & she replied "a lot, everyday."  Pt admits that she continued to smoke MJ until the beginning her 3rd trimester.  She explained that MJ help with appetite.  She denies other illegal substance use & verbalized an understanding of hospital drug testing policy.  UDS & meconium collections are pending.  As CSW was speaking with pt, her mother looked at her & said "so now you going to have these people coming to my house again?"  CSW explained to [facility] that this Probation officer works for the hospital & identified  the differences between [employer], (CPS) & this writers role.  Pt has previous CPS involvement which resulted in the removal of her children.  According to [facility], CPS staff completed a kinship agreement, placing pt's 3 younger children in her care.  MGM has custody of pt's 3 younger children.  Pt does not think CPS staff will have any concerns about her caring for this baby.  MGM & pt said CPS case is currently closed.  CSW noticed a changed in pt's demeanor after discussing CPS involvement.  She appeared reluctant to give detail about the circumstances surrounding CPS involvement.  Since pt has CPS history that resulted in the removal of her children, this CSW made a CPS referral.  The case was accepted & assigned to [employer].  Pt has all the necessary supplies for the infant & identified her mother, as her primary support person.  CSW will follow up with CPS worker & continue to monitor drug screen results.        VI SOCIAL WORK PLAN Social Work Plan  Psychosocial Support/Ongoing Assessment of Needs  Child Protective Services Report   Type of pt/family education:   If child protective services report - county:  [place] If child protective services report - date:  08/04/2013 Information/referral to community resources comment:   Other social work plan:

## 2013-08-04 NOTE — Op Note (Signed)
Present for entire procedure.  Attestation of Attending Supervision of Fellow: Evaluation and management procedures were performed by the Fellow under my supervision and collaboration. I have reviewed the Fellow's note and chart, and I agree with the management and plan.  

## 2013-08-04 NOTE — Anesthesia Preprocedure Evaluation (Addendum)
Anesthesia Evaluation    Airway Mallampati: III TM Distance: >3 FB Neck ROM: Full    Dental no notable dental hx. (+) Teeth Intact   Pulmonary asthma , former smoker,  breath sounds clear to auscultation  Pulmonary exam normal       Cardiovascular Rhythm:Regular Rate:Normal     Neuro/Psych PSYCHIATRIC DISORDERS Anxiety Depression Bipolar Disorder ADHD   GI/Hepatic negative GI ROS, GERD-  Medicated and Controlled,(+)     substance abuse  alcohol use and marijuana use,   Endo/Other  Morbid obesityObesity  Renal/GU negative Renal ROS     Musculoskeletal negative musculoskeletal ROS (+)   Abdominal (+) + obese,   Peds  Hematology negative hematology ROS (+)   Anesthesia Other Findings   Reproductive/Obstetrics HSV Desires permanent contraception                          Anesthesia Physical  Anesthesia Plan  ASA: III  Anesthesia Plan: Epidural   Post-op Pain Management:    Induction:   Airway Management Planned: Natural Airway  Additional Equipment:   Intra-op Plan:   Post-operative Plan:   Informed Consent: I have reviewed the patients History and Physical, chart, labs and discussed the procedure including the risks, benefits and alternatives for the proposed anesthesia with the patient or authorized representative who has indicated his/her understanding and acceptance.     Plan Discussed with: Anesthesiologist  Anesthesia Plan Comments:         Anesthesia Quick Evaluation

## 2013-08-04 NOTE — Lactation Note (Signed)
This note was copied from the chart of Karen Lester. Lactation Consultation Note Initial consult:  [name] 6816 hours old and sleeping.  Mother is getting ready to go for BTL.  P6.  Has attempted breastfeeding with other children but had trouble latching, this baby has been breastfeeding much better mother states.  Denies any soreness or problems. Reviewed basics, deep wide latch, feeding him 8-12 times a day, lactation support services and brochure.  Encouraged mother to call for further assistance or questions.   Patient Name: Karen Osa Craverlyssia Mcwhirter JXBJY'NToday's Date: 08/04/2013 Reason for consult: Initial assessment   Maternal Data Formula Feeding for Exclusion: Yes Reason for exclusion: Mother's choice to formula feed on admision  Feeding Feeding Type: Bottle Fed - Formula  LATCH Score/Interventions                      Lactation Tools Discussed/Used     Consult Status Consult Status: Follow-up Date: 08/05/13 Follow-up type: In-patient    Dahlia ByesBerkelhammer, Ruth [facility] 08/04/2013, 1:05 PM

## 2013-08-04 NOTE — Progress Notes (Signed)
Patient ID: Karen Lester, female   DOB: 04-29-1986, 28 y.o.   MRN: [id number] Post Partum Day 1  Subjective: Patient is doing well with no complaints. No pain, bleeding is similar to her period but less than yesterday. She is walking and voiding with no difficulties. +flatus, no bowel movt yet. Currently NPO, scheduled for BTL at 1.15pm today. Contraception: BTL Breast feeding Plans for circumcision in Office  Objective: Blood pressure 105/64, pulse 65, temperature 98.1 F (36.7 C), temperature source Oral, resp. rate 18, height 5\' 5"  (1.651 m), weight 96.616 kg (213 lb), last menstrual period 11/13/2012, SpO2 99.00%, unknown if currently breastfeeding.  Physical Exam:  General: alert, cooperative and no distress Lochia: appropriate Uterine Fundus: firm DVT Evaluation: No evidence of DVT seen on physical exam.   Recent Labs  08/03/13 0850  HGB 11.2*  HCT 32.7*    Assessment/Plan: Lactation Consult Contraception: BTL    LOS: 1 day   Muazu, Aisha 08/04/2013, 7:26 AM   I have seen and examined this patient and agree with above documentation in the medical student's note.   Rulon AbideKeli Anoushka Divito, M.D. [facility] Fellow 08/04/2013 8:59 AM

## 2013-08-04 NOTE — Plan of Care (Signed)
Problem: Phase II Progression Outcomes Goal: Tolerating diet Outcome: Not Applicable Date Met:  94/85/46 NPO for tubal

## 2013-08-04 NOTE — MAU Provider Note (Signed)
Attestation of Attending Supervision of Fellow: Evaluation and management procedures were performed by the Fellow under my supervision and collaboration.  I have reviewed the Fellow's note and chart, and I agree with the management and plan.    

## 2013-08-04 NOTE — Anesthesia Postprocedure Evaluation (Signed)
Anesthesia Post Note  Patient: Karen Lester  Procedure(s) Performed: * No procedures listed *  Anesthesia type: Epidural  Patient location: Mother/Baby  Post pain: Pain level controlled  Post assessment: Post-op Vital signs reviewed  Last Vitals:  Filed Vitals:   08/04/13 0330  BP: 105/64  Pulse: 65  Temp: 36.7 C  Resp: 18    Post vital signs: Reviewed  Level of consciousness: awake  Complications: No apparent anesthesia complications

## 2013-08-05 ENCOUNTER — Encounter (HOSPITAL_COMMUNITY): Payer: Self-pay | Admitting: Obstetrics & Gynecology

## 2013-08-05 MED ORDER — IBUPROFEN 600 MG PO TABS: 600.0000 mg | ORAL_TABLET | Freq: Four times a day (QID) | ORAL | Status: DC

## 2013-08-05 NOTE — Discharge Summary (Signed)
Obstetric Discharge Summary Reason for Admission: induction of labor Prenatal Procedures: none Intrapartum Procedures: spontaneous vaginal delivery Postpartum Procedures: none Complications-Operative and Postpartum: none  Hospital Course: Admitted for IOL for PD. Pt was treated for HSV with acyclovir. Progressed and delivered as below. No acute issues PP, desires discharge. Will send home after BTL and bottle feeding. No acute isseus   Delivery Note At 8:22 PM a viable female was delivered via Vaginal, Spontaneous Delivery (Presentation: ; Occiput Anterior).  APGAR: 8, 9; weight 8+6.   Placenta status: spontaneous, intact .  Cord: 3 vessels with the following complications: None.  Cord pH: not sent  Anesthesia: Epidural  Episiotomy: None Lacerations: None Suture Repair: N/A Est. Blood Loss (mL): 100  Mom to postpartum.  Baby to Couplet care / Skin to Skin.  Selena LesserBraimah, Tina 08/03/2013, 8:43 PM  I have seen and examined this patient and I agree with the above. Cam HaiSHAW, KIMBERLY 9:58 PM 08/03/2013  Filed Vitals:   08/05/13 0745  BP: 114/73  Pulse: 73  Temp: 97.6 F (36.4 C)  Resp: 20   NAD VSS NTTP firm uterus No c/c, neg homans, no palpable cords   H/H: Lab Results  Component Value Date/Time   HGB 11.2* 08/03/2013  8:50 AM   HCT 32.7* 08/03/2013  8:50 AM      Discharge Diagnoses: Term Pregnancy-delivered  Discharge Information: Date: 01/23/2011 Activity: pelvic rest Diet: routine  Medications: PNV and Ibuprofen Breast feeding:  Yes Condition: stable Instructions: refer to handout Discharge to: home      Medication List    STOP taking these medications       valACYclovir 1000 MG tablet  Commonly known as:  VALTREX      TAKE these medications       albuterol 108 (90 BASE) MCG/ACT inhaler  Commonly known as:  PROVENTIL HFA;VENTOLIN HFA  Inhale 1-2 puffs into the lungs every 6 (six) hours as needed for wheezing or shortness of breath (rescue).     cyclobenzaprine 5 MG tablet  Commonly known as:  FLEXERIL  Take 5 mg by mouth 3 (three) times daily as needed for muscle spasms.     ibuprofen 600 MG tablet  Commonly known as:  ADVIL,MOTRIN  Take 1 tablet (600 mg total) by mouth every 6 (six) hours.     prenatal multivitamin Tabs tablet  Take 1 tablet by mouth daily at 12 noon.           Follow-up Information   [facility] [place] In 6 weeks.   Contact information:   [address] [place] [id number] [phone]      Teisha Trowbridge, Audie ClearMICHAEL RYAN 08/05/2013,9:16 AM

## 2013-08-05 NOTE — Discharge Instructions (Signed)

## 2013-08-05 NOTE — Progress Notes (Signed)
CPS worker, Alveria Apley met with pt & MGM yesterday evening to complete a safety assessment. Karen Lester does not have any concerns about infant discharging home with MOB. No barriers to discharge when MOB & infant are medically stable.

## 2013-08-05 NOTE — Progress Notes (Deleted)
Patient ID: Karen Lester, female   DOB: 06-Nov-1985, 28 y.o.   MRN: [id number] Post Partum Day 2 Subjective: pt had a BTL yesterday. Doing well today, feels sore at the site of Incision. Bleeding is light. Tolerating PO. Up and walking. No probelms with voiding. +flatus. No bowel movt yet Breastfeeding with no difficulties  Objective: Blood pressure 114/73, pulse 73, temperature 97.6 F (36.4 C), temperature source Oral, resp. rate 20, height 5\' 5"  (1.651 m), weight 96.616 kg (213 lb), last menstrual period 11/13/2012, SpO2 98.00%, unknown if currently breastfeeding.  Physical Exam:  General: alert, cooperative and no distress Lochia: appropriate Uterine Fundus: firm Incision: healing well DVT Evaluation: No evidence of DVT seen on physical exam.   Recent Labs  08/03/13 0850  HGB 11.2*  HCT 32.7*    Assessment/Plan: Discharge home   LOS: 2 days   Joseff Luckman 08/05/2013, 9:01 AM

## 2013-08-06 ENCOUNTER — Inpatient Hospital Stay (HOSPITAL_COMMUNITY): Admission: RE | Admit: 2013-08-06 | Payer: Medicare Other | Source: Ambulatory Visit

## 2013-08-08 NOTE — Progress Notes (Signed)
Ur chart review completed per request.  

## 2013-09-05 ENCOUNTER — Ambulatory Visit: Payer: Medicare (Managed Care) | Admitting: Advanced Practice Midwife

## 2013-11-05 ENCOUNTER — Emergency Department (HOSPITAL_COMMUNITY)
Admission: EM | Admit: 2013-11-05 | Discharge: 2013-11-05 | Disposition: A | Discharge status: Home / Self Care | Payer: Medicare (Managed Care) | Attending: Emergency Medicine | Admitting: Emergency Medicine

## 2013-11-05 ENCOUNTER — Emergency Department (HOSPITAL_COMMUNITY): Payer: Medicare (Managed Care)

## 2013-11-05 ENCOUNTER — Encounter (HOSPITAL_COMMUNITY): Payer: Self-pay | Admitting: Emergency Medicine

## 2013-11-05 DIAGNOSIS — F411 Generalized anxiety disorder: Secondary | ICD-10-CM | POA: Insufficient documentation

## 2013-11-05 DIAGNOSIS — F909 Attention-deficit hyperactivity disorder, unspecified type: Secondary | ICD-10-CM | POA: Insufficient documentation

## 2013-11-05 DIAGNOSIS — Z87891 Personal history of nicotine dependence: Secondary | ICD-10-CM | POA: Insufficient documentation

## 2013-11-05 DIAGNOSIS — J45909 Unspecified asthma, uncomplicated: Secondary | ICD-10-CM | POA: Insufficient documentation

## 2013-11-05 DIAGNOSIS — F329 Major depressive disorder, single episode, unspecified: Secondary | ICD-10-CM | POA: Insufficient documentation

## 2013-11-05 DIAGNOSIS — F3289 Other specified depressive episodes: Secondary | ICD-10-CM | POA: Insufficient documentation

## 2013-11-05 DIAGNOSIS — F319 Bipolar disorder, unspecified: Secondary | ICD-10-CM | POA: Insufficient documentation

## 2013-11-05 DIAGNOSIS — B009 Herpesviral infection, unspecified: Secondary | ICD-10-CM | POA: Insufficient documentation

## 2013-11-05 MED ORDER — HYDROCOD POLST-CHLORPHEN POLST 10-8 MG/5ML PO LQCR: 5.0000 mL | Freq: Two times a day (BID) | ORAL | Status: DC | PRN

## 2013-11-05 MED ORDER — PREDNISONE 20 MG PO TABS
60.0000 mg | ORAL_TABLET | Freq: Once | ORAL | Status: AC
  Administered 2013-11-05: 60 mg via ORAL
  Filled 2013-11-05: qty 3

## 2013-11-05 MED ORDER — IPRATROPIUM BROMIDE 0.02 % IN SOLN
0.5000 mg | Freq: Once | RESPIRATORY_TRACT | Status: AC
  Administered 2013-11-05: 0.5 mg via RESPIRATORY_TRACT
  Filled 2013-11-05: qty 2.5

## 2013-11-05 MED ORDER — ALBUTEROL SULFATE HFA 108 (90 BASE) MCG/ACT IN AERS
2.0000 | INHALATION_SPRAY | RESPIRATORY_TRACT | Status: DC | PRN
  Administered 2013-11-05: 2 via RESPIRATORY_TRACT
  Filled 2013-11-05: qty 6.7

## 2013-11-05 MED ORDER — ALBUTEROL SULFATE (2.5 MG/3ML) 0.083% IN NEBU
5.0000 mg | INHALATION_SOLUTION | Freq: Once | RESPIRATORY_TRACT | Status: AC
  Administered 2013-11-05: 5 mg via RESPIRATORY_TRACT
  Filled 2013-11-05: qty 6

## 2013-11-05 NOTE — ED Notes (Signed)
Pt in radiology 

## 2013-11-05 NOTE — ED Notes (Signed)
Pt reports increase in asthma, sob. Has been out of inhaler, spo2 99% at triage, no distress noted.

## 2013-11-05 NOTE — Discharge Instructions (Signed)
Take tussionex as needed for cough.  Do not drive within four hours of taking this medication (may cause drowsiness or confusion).   Use albuterol inhaler, 2 puffs every 4 hours, as needed for cough, wheezing and shortness of breath.  Take ibuprofen for sore throat.  Get rest and drink plenty of fluids.  Return to ER for increased difficulty breathing.  Call [facility] [phone]) if you do not have a primary care doctor and would like assistance with finding one.    Thank You.

## 2013-11-05 NOTE — ED Notes (Signed)
Pt c/o cough X 2 weeks, sts she was [place] in [place] and was given liquid hydrocodone to help her sleep at night. Pt sts she has run out of this medicine and requesting another prescription. sts hx of asthma and bronchitis. sts she usually uses an inhaler when the pollen gets bad but has run out of that too and needs another prescription for that too. Pt reports she feels like she needs a breathing treatment. Pt in nad, skin warm and dry, resp e/u. Speaking in full sentences.

## 2013-11-05 NOTE — ED Provider Notes (Signed)
CSN: [id number]     Arrival date & time 11/05/13  1111 History   First MD Initiated Contact with Patient 11/05/13 1123     Chief Complaint  Patient presents with  . Asthma     (Consider location/radiation/quality/duration/timing/severity/associated sxs/prior Treatment) HPI History provided by pt.  Pt c/o dyspnea.  Has had wheezing for the past two weeks with associated rhinorrhea and sore throat.  Took multiple OTC cold medications w/out relief.  Developed a cough 1 week ago and it is progressively worsening and now productive and causing diffuse chest soreness.  No other associated sx.  Last night she had a typical asthma exacerbation that improved with a saline neb.  She has taken what she believes is hydrocodone syrup in the past with relief of her symptoms and is requesting a refill.    Past Medical History  Diagnosis Date  . Herpes   . Urinary tract infection   . ADHD (attention deficit hyperactivity disorder)   . Bipolar 1 disorder   . Anxiety   . Asthma   . Abnormal Pap smear     had colpo  . Depression     doing fine, not currently on meds  . Vaginal Pap smear, abnormal    Past Surgical History  Procedure Laterality Date  . Induced abortion    . Colposcopy    . Tubal ligation Bilateral 08/04/2013    Procedure: POST PARTUM TUBAL LIGATION;  Surgeon: Lesly DukesKelly H Leggett, MD;  Location: [facility];  Service: Gynecology;  Laterality: Bilateral;   Family History  Problem Relation Age of Onset  . Hypertension Mother   . Mental retardation Brother     down's syndrome  . Asthma Son    History  Substance Use Topics  . Smoking status: Former Smoker -- 0.25 packs/day for 16 years    Types: Cigarettes    Quit date: 06/13/2013  . Smokeless tobacco: Never Used  . Alcohol Use: No     Comment: OCcas.   OB History   Grav Para Term Preterm Abortions TAB SAB Ect Mult Living   6 5 5  1 1    5      Review of Systems  All other systems reviewed and are negative.     Allergies   Review of patient's allergies indicates no known allergies.  Home Medications   Prior to Admission medications   Medication Sig Start Date End Date Taking? Authorizing Provider  albuterol (PROVENTIL HFA;VENTOLIN HFA) 108 (90 BASE) MCG/ACT inhaler Inhale 1-2 puffs into the lungs every 6 (six) hours as needed for wheezing or shortness of breath (rescue).    Historical Provider, MD  cyclobenzaprine (FLEXERIL) 5 MG tablet Take 5 mg by mouth 3 (three) times daily as needed for muscle spasms.    Historical Provider, MD  ibuprofen (ADVIL,MOTRIN) 600 MG tablet Take 1 tablet (600 mg total) by mouth every 6 (six) hours. 08/05/13   Minta BalsamMichael R Odom, MD  Prenatal Vit-Fe Fumarate-FA (PRENATAL MULTIVITAMIN) TABS tablet Take 1 tablet by mouth daily at 12 noon.    Historical Provider, MD   BP 107/77  Pulse 61  Temp(Src) 98.3 F (36.8 C) (Oral)  Resp 18  SpO2 94% Physical Exam  Nursing note and vitals reviewed. Constitutional: She is oriented to person, place, and time. She appears well-developed and well-nourished. No distress.  HENT:  Head: Normocephalic and atraumatic.  Eyes:  Normal appearance  Neck: Normal range of motion.  Cardiovascular: Normal rate and regular rhythm.   Pulmonary/Chest: Effort  normal and breath sounds normal. No respiratory distress.  Mild, diffuse chest tenderness.  Exaggerated upper airway sounds and slight, diffuse expiratory rhonchi w/ prolonged expiratory phase.   Abdominal: Soft. Bowel sounds are normal. She exhibits no distension. There is no tenderness.  Musculoskeletal: Normal range of motion.  Neurological: She is alert and oriented to person, place, and time.  Skin: Skin is warm and dry. No rash noted.  Psychiatric: She has a normal mood and affect. Her behavior is normal.    ED Course  Procedures (including critical care time) Labs Review Labs Reviewed - No data to display  Imaging Review Dg Chest 2 View  11/05/2013   CLINICAL DATA:  Shortness of breath  and cough for 2 weeks.  EXAM: CHEST  2 VIEW  COMPARISON:  Thoracic spine radiographs 11/26/2011  FINDINGS: The cardiomediastinal silhouette is within normal limits. The lungs are well inflated and clear. There is no evidence of pleural effusion or pneumothorax. No acute osseous abnormality is identified.  IMPRESSION: No active cardiopulmonary disease.   Electronically Signed   By: Sebastian AcheAllen  Grady   On: 11/05/2013 12:26     EKG Interpretation None      MDM   Final diagnoses:  Asthma    27yo asthmatic F presents w/ dyspnea, wheezing and progressively worsening cough x 1-2 weeks. She is requesting refill for what she believes is hydrocodone syrup, which has "broken up the cough" in the past. On exam, afebrile and VS w/in nml range, no respiratory distress, exaggerated upper airway sounds w/ mild diffuse expiratory rhonchi and prolonged expiratory phase, mild, diffuse chest tp.  CXR ordered d/t course of sx as well as atypical productive nature of cough.  Pt to receive a breathing treatment and prednisone. 11:46 AM   CXR neg.  Breath sounds improved and pt coughing infrequently.  RT has provided her with albuterol HFA.  Pt continues to complain of chest pain.  Claims to not be able to swallow pills and that ibuprofen doesn't work for her.  She is insistent that she receive hydrocodone syrup.  I explained to her that this may give her temporary relief of cough/CP but that she should take an NSAID and use her inhaler.  Prescribed 40mL total of tussionex and referred to primary care. Return precautions discussed. 1:12 PM   Otilio Miuatherine E Airam Runions, PA-C 11/05/13 1312

## 2013-11-14 NOTE — ED Provider Notes (Signed)
Medical screening examination/treatment/procedure(s) were performed by non-physician practitioner and as supervising physician I was immediately available for consultation/collaboration.   EKG Interpretation None        Monterrius Cardosa, MD 11/14/13 2205 

## 2014-02-17 ENCOUNTER — Encounter (HOSPITAL_COMMUNITY): Payer: Self-pay | Admitting: Emergency Medicine

## 2014-02-17 ENCOUNTER — Emergency Department (HOSPITAL_COMMUNITY)
Admission: EM | Admit: 2014-02-17 | Discharge: 2014-02-17 | Discharge status: Against Medical Advice | Payer: Medicare (Managed Care) | Attending: Emergency Medicine | Admitting: Emergency Medicine

## 2014-02-17 DIAGNOSIS — Z87891 Personal history of nicotine dependence: Secondary | ICD-10-CM | POA: Diagnosis not present

## 2014-02-17 DIAGNOSIS — Z87440 Personal history of urinary (tract) infections: Secondary | ICD-10-CM | POA: Insufficient documentation

## 2014-02-17 DIAGNOSIS — Z9889 Other specified postprocedural states: Secondary | ICD-10-CM | POA: Diagnosis not present

## 2014-02-17 DIAGNOSIS — N898 Other specified noninflammatory disorders of vagina: Secondary | ICD-10-CM | POA: Insufficient documentation

## 2014-02-17 DIAGNOSIS — Z9851 Tubal ligation status: Secondary | ICD-10-CM | POA: Insufficient documentation

## 2014-02-17 DIAGNOSIS — Z8659 Personal history of other mental and behavioral disorders: Secondary | ICD-10-CM | POA: Diagnosis not present

## 2014-02-17 DIAGNOSIS — R109 Unspecified abdominal pain: Secondary | ICD-10-CM | POA: Diagnosis present

## 2014-02-17 DIAGNOSIS — J45909 Unspecified asthma, uncomplicated: Secondary | ICD-10-CM | POA: Insufficient documentation

## 2014-02-17 DIAGNOSIS — Z8619 Personal history of other infectious and parasitic diseases: Secondary | ICD-10-CM | POA: Diagnosis not present

## 2014-02-17 LAB — GC/CHLAMYDIA PROBE AMP
CT Probe RNA: NEGATIVE
GC Probe RNA: NEGATIVE

## 2014-02-17 LAB — WET PREP, GENITAL
Clue Cells Wet Prep HPF POC: NONE SEEN
Trich, Wet Prep: NONE SEEN
YEAST WET PREP: NONE SEEN

## 2014-02-17 LAB — HIV ANTIBODY (ROUTINE TESTING W REFLEX): HIV: NONREACTIVE

## 2014-02-17 LAB — RPR

## 2014-02-17 NOTE — ED Notes (Signed)
PA at bedside.

## 2014-02-17 NOTE — ED Notes (Addendum)
Pt. Signed herself left  AMA, PA made aware.

## 2014-02-17 NOTE — ED Provider Notes (Signed)
Medical screening examination/treatment/procedure(s) were performed by non-physician practitioner and as supervising physician I was immediately available for consultation/collaboration.   EKG Interpretation None      Devoria AlbeIva Elio Haden, MD, Armando GangFACEP   Ward GivensIva L Miracle Mongillo, MD 02/17/14 [phone]

## 2014-02-17 NOTE — ED Notes (Signed)
Pt complains of lower back pain, abd pain and vaginal discharge for two days

## 2014-02-17 NOTE — ED Provider Notes (Signed)
CSN: [id number]     Arrival date & time 02/17/14  0134 History   First MD Initiated Contact with Patient 02/17/14 0157     Chief Complaint  Patient presents with  . Abdominal Pain     (Consider location/radiation/quality/duration/timing/severity/associated sxs/prior Treatment) HPI  Karen Lester is a(n) 28 y.o. female who presents to the emergency department chief complaint the abdominal pain and vaginal discomfort. Patient states that [facility] and was told that she had a tubal pregnancy. She states that she is in otherwise she would have a pregnancy because she's had a tubal ligation. She is complaining of 2 days right flank pain with radiation into the abdomen. She states that the pain is constant, aching, chills associated urgency to urinate and dysuria. Patient is just finishing her period now. She also complains of foul odor and discharge from her vagina. She has pain with intercourse. She denies fevers, chills, nausea, vomiting. Patient is on her cell phone throughout the entire interview, although asked him multiple times.  Past Medical History  Diagnosis Date  . Herpes   . Urinary tract infection   . ADHD (attention deficit hyperactivity disorder)   . Bipolar 1 disorder   . Anxiety   . Asthma   . Abnormal Pap smear     had colpo  . Depression     doing fine, not currently on meds  . Vaginal Pap smear, abnormal    Past Surgical History  Procedure Laterality Date  . Induced abortion    . Colposcopy    . Tubal ligation Bilateral 08/04/2013    Procedure: POST PARTUM TUBAL LIGATION;  Surgeon: Lesly Dukes, MD;  Location: [facility];  Service: Gynecology;  Laterality: Bilateral;   Family History  Problem Relation Age of Onset  . Hypertension Mother   . Mental retardation Brother     down's syndrome  . Asthma Son    History  Substance Use Topics  . Smoking status: Former Smoker -- 0.25 packs/day for 16 years    Types: Cigarettes   Quit date: 06/13/2013  . Smokeless tobacco: Never Used  . Alcohol Use: No     Comment: OCcas.   OB History   Grav Para Term Preterm Abortions TAB SAB Ect Mult Living   6 5 5  1 1    5      Review of Systems  Ten systems reviewed and are negative for acute change, except as noted in the HPI.    Allergies  Review of patient's allergies indicates no known allergies.  Home Medications   Prior to Admission medications   Medication Sig Start Date End Date Taking? Authorizing Provider  albuterol (PROVENTIL HFA;VENTOLIN HFA) 108 (90 BASE) MCG/ACT inhaler Inhale 1-2 puffs into the lungs every 6 (six) hours as needed for wheezing or shortness of breath (rescue).   Yes Historical Provider, MD   BP 126/78  Pulse 64  Temp(Src) 98 F (36.7 C) (Oral)  Resp 18  SpO2 100%  LMP 02/16/2014 Physical Exam  Vitals reviewed. Constitutional: She is oriented to person, place, and time. She appears well-developed and well-nourished. No distress.  HENT:  Head: Normocephalic and atraumatic.  Eyes: Conjunctivae are normal. No scleral icterus.  Neck: Normal range of motion.  Cardiovascular: Normal rate, regular rhythm and normal heart sounds.  Exam reveals no gallop and no friction rub.   No murmur heard. Pulmonary/Chest: Effort normal and breath sounds normal. No respiratory distress.  Abdominal: Soft. Bowel sounds are normal. She exhibits no distension and no mass. There is no tenderness. There is no guarding.  No cva tenderness  Genitourinary:  Pelvic exam: VULVA: normal appearing vulva with no masses, tenderness or lesions, VAGINA: normal appearing vagina with normal color and discharge, no lesions, CERVIX: cervical discharge present - bloody and mucoid, nonpurulent UTERUS: uterus is normal size, shape, consistency and nontender, ADNEXA: normal adnexa in size, nontender and no masses.  Neurological: She is alert and oriented to person, place, and time.  Skin: Skin is warm and dry. She is not  diaphoretic.    ED Course  Procedures (including critical care time) Labs Review Labs Reviewed  WET PREP, GENITAL - Abnormal; Notable for the following:    WBC, Wet Prep HPF POC FEW (*)    All other components within normal limits  GC/CHLAMYDIA PROBE AMP  URINALYSIS, ROUTINE W REFLEX MICROSCOPIC  RPR  HIV ANTIBODY (ROUTINE TESTING)    Imaging Review No results found.   EKG Interpretation None      MDM   Final diagnoses:  None    Filed Vitals:   02/17/14 0140  BP: 126/78  Pulse: 64  Temp: 98 F (36.7 C)  TempSrc: Oral  Resp: 18  SpO2: 100%   Patient with multiple complaints. She is a bit tangential and difficult to understand. She asked the nurse tech for ativan for her anxiety. I did request the patient's records form [facility]. Patient left after pelvic exam stating that she did not have time to wait for all theses tests. The patient left before I  Could discuss risks and benefits of leaving AMA.     Arthor CaptainAbigail Glorious Flicker, PA-C 02/17/14 [phone]

## 2014-04-18 ENCOUNTER — Emergency Department (HOSPITAL_COMMUNITY)
Admission: EM | Admit: 2014-04-18 | Discharge: 2014-04-18 | Disposition: A | Discharge status: Home / Self Care | Payer: Medicare (Managed Care) | Attending: Emergency Medicine | Admitting: Emergency Medicine

## 2014-04-18 ENCOUNTER — Encounter (HOSPITAL_COMMUNITY): Payer: Self-pay | Admitting: Emergency Medicine

## 2014-04-18 ENCOUNTER — Emergency Department (HOSPITAL_COMMUNITY): Payer: Medicare (Managed Care)

## 2014-04-18 DIAGNOSIS — S0990XA Unspecified injury of head, initial encounter: Secondary | ICD-10-CM | POA: Diagnosis present

## 2014-04-18 DIAGNOSIS — Z87440 Personal history of urinary (tract) infections: Secondary | ICD-10-CM | POA: Diagnosis not present

## 2014-04-18 DIAGNOSIS — G4489 Other headache syndrome: Secondary | ICD-10-CM

## 2014-04-18 DIAGNOSIS — Z8619 Personal history of other infectious and parasitic diseases: Secondary | ICD-10-CM | POA: Insufficient documentation

## 2014-04-18 DIAGNOSIS — J45909 Unspecified asthma, uncomplicated: Secondary | ICD-10-CM | POA: Insufficient documentation

## 2014-04-18 DIAGNOSIS — Z8659 Personal history of other mental and behavioral disorders: Secondary | ICD-10-CM | POA: Insufficient documentation

## 2014-04-18 DIAGNOSIS — Z87891 Personal history of nicotine dependence: Secondary | ICD-10-CM | POA: Diagnosis not present

## 2014-04-18 DIAGNOSIS — S0511XA Contusion of eyeball and orbital tissues, right eye, initial encounter: Secondary | ICD-10-CM | POA: Diagnosis not present

## 2014-04-18 DIAGNOSIS — Z79899 Other long term (current) drug therapy: Secondary | ICD-10-CM | POA: Diagnosis not present

## 2014-04-18 MED ORDER — NAPROXEN 500 MG PO TABS
500.0000 mg | ORAL_TABLET | Freq: Once | ORAL | Status: AC
  Administered 2014-04-18: 500 mg via ORAL
  Filled 2014-04-18: qty 1

## 2014-04-18 MED ORDER — MELOXICAM 15 MG PO TABS: 15.0000 mg | ORAL_TABLET | Freq: Every day | ORAL | Status: DC

## 2014-04-18 MED ORDER — HYDROCODONE-ACETAMINOPHEN 5-325 MG PO TABS
2.0000 | ORAL_TABLET | Freq: Once | ORAL | Status: AC
  Administered 2014-04-18: 2 via ORAL
  Filled 2014-04-18: qty 2

## 2014-04-18 MED ORDER — TRAMADOL HCL 50 MG PO TABS: 50.0000 mg | ORAL_TABLET | Freq: Four times a day (QID) | ORAL | Status: DC | PRN
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
## 2014-04-18 NOTE — Discharge Instructions (Signed)
Take Tramadol as needed for pain. Refer to attached documents for more information.  °

## 2014-04-18 NOTE — ED Notes (Signed)
Patient with no complaints at this time. Respirations even and unlabored. Skin warm/dry. Discharge instructions reviewed with patient at this time. Patient given opportunity to voice concerns/ask questions. Patient discharged at this time and left Emergency Department with steady gait.   
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
## 2014-04-19 NOTE — ED Provider Notes (Signed)
Medical screening examination/treatment/procedure(s) were performed by non-physician practitioner and as supervising physician I was immediately available for consultation/collaboration.   EKG Interpretation None        Kaneshia Cater M Daryan Cagley, MD 04/19/14 0103 

## 2014-05-15 ENCOUNTER — Encounter (HOSPITAL_COMMUNITY): Payer: Self-pay | Admitting: Emergency Medicine

## 2014-06-10 ENCOUNTER — Emergency Department (HOSPITAL_COMMUNITY): Payer: Medicare (Managed Care)

## 2014-06-10 ENCOUNTER — Encounter (HOSPITAL_COMMUNITY): Payer: Self-pay | Admitting: *Deleted

## 2014-06-10 ENCOUNTER — Emergency Department (HOSPITAL_COMMUNITY)
Admission: EM | Admit: 2014-06-10 | Discharge: 2014-06-10 | Disposition: A | Discharge status: Home / Self Care | Payer: Medicare (Managed Care) | Attending: Emergency Medicine | Admitting: Emergency Medicine

## 2014-06-10 DIAGNOSIS — R05 Cough: Secondary | ICD-10-CM | POA: Diagnosis present

## 2014-06-10 DIAGNOSIS — J45901 Unspecified asthma with (acute) exacerbation: Secondary | ICD-10-CM | POA: Diagnosis not present

## 2014-06-10 DIAGNOSIS — R059 Cough, unspecified: Secondary | ICD-10-CM

## 2014-06-10 DIAGNOSIS — Z79899 Other long term (current) drug therapy: Secondary | ICD-10-CM | POA: Insufficient documentation

## 2014-06-10 DIAGNOSIS — Z87440 Personal history of urinary (tract) infections: Secondary | ICD-10-CM | POA: Insufficient documentation

## 2014-06-10 DIAGNOSIS — Z8619 Personal history of other infectious and parasitic diseases: Secondary | ICD-10-CM | POA: Insufficient documentation

## 2014-06-10 DIAGNOSIS — Z87891 Personal history of nicotine dependence: Secondary | ICD-10-CM | POA: Insufficient documentation

## 2014-06-10 DIAGNOSIS — Z3202 Encounter for pregnancy test, result negative: Secondary | ICD-10-CM | POA: Diagnosis not present

## 2014-06-10 DIAGNOSIS — Z8659 Personal history of other mental and behavioral disorders: Secondary | ICD-10-CM | POA: Diagnosis not present

## 2014-06-10 DIAGNOSIS — Z791 Long term (current) use of non-steroidal anti-inflammatories (NSAID): Secondary | ICD-10-CM | POA: Insufficient documentation

## 2014-06-10 LAB — POC URINE PREG, ED: Preg Test, Ur: NEGATIVE

## 2014-06-10 MED ORDER — ALBUTEROL SULFATE HFA 108 (90 BASE) MCG/ACT IN AERS: 2.0000 | INHALATION_SPRAY | RESPIRATORY_TRACT | Status: DC | PRN

## 2014-06-10 MED ORDER — HYDROCOD POLST-CHLORPHEN POLST 10-8 MG/5ML PO LQCR: 5.0000 mL | Freq: Two times a day (BID) | ORAL | Status: DC | PRN

## 2014-06-10 MED ORDER — IPRATROPIUM-ALBUTEROL 0.5-2.5 (3) MG/3ML IN SOLN
3.0000 mL | RESPIRATORY_TRACT | Status: DC
  Administered 2014-06-10: 3 mL via RESPIRATORY_TRACT
  Filled 2014-06-10: qty 3

## 2014-06-10 NOTE — ED Notes (Signed)
Urine to lab

## 2014-06-10 NOTE — Discharge Instructions (Signed)
Cough, Adult  A cough is a reflex that helps clear your throat and airways. It can help heal the body or may be a reaction to an irritated airway. A cough may only last 2 or 3 weeks (acute) or may last more than 8 weeks (chronic).  CAUSES Acute cough:  Viral or bacterial infections. Chronic cough:  Infections.  Allergies.  Asthma.  Post-nasal drip.  Smoking.  Heartburn or acid reflux.  Some medicines.  Chronic lung problems (COPD).  Cancer. SYMPTOMS   Cough.  Fever.  Chest pain.  Increased breathing rate.  High-pitched whistling sound when breathing (wheezing).  Colored mucus that you cough up (sputum). TREATMENT   A bacterial cough may be treated with antibiotic medicine.  A viral cough must run its course and will not respond to antibiotics.  Your caregiver may recommend other treatments if you have a chronic cough. HOME CARE INSTRUCTIONS   Only take over-the-counter or prescription medicines for pain, discomfort, or fever as directed by your caregiver. Use cough suppressants only as directed by your caregiver.  Use a cold steam vaporizer or humidifier in your bedroom or home to help loosen secretions.  Sleep in a semi-upright position if your cough is worse at night.  Rest as needed.  Stop smoking if you smoke. SEEK IMMEDIATE MEDICAL CARE IF:   You have pus in your sputum.  Your cough starts to worsen.  You cannot control your cough with suppressants and are losing sleep.  You begin coughing up blood.  You have difficulty breathing.  You develop pain which is getting worse or is uncontrolled with medicine.  You have a fever. MAKE SURE YOU:   Understand these instructions.  Will watch your condition.  Will get help right away if you are not doing well or get worse. Document Released: 12/27/2010 Document Revised: 09/22/2011 Document Reviewed: 12/27/2010 [facility] Patient Information 2015 [employer], [place]. This information is not intended  to replace advice given to you by your health care provider. Make sure you discuss any questions you have with your health care provider.   Your evaluated in the ED today for your cough. He reported feeling better after having received a DuoNeb treatment. Your chest x-ray did not show any acute or emergent infections. He may take your prescribed Tussionex as directed for any discomfort he may experience for your cough. Please follow-up with your primary care within 3-5 days. Return to ED for worsening symptoms, shortness of breath, chest pain, numbness or weakness, fevers.

## 2014-06-10 NOTE — ED Provider Notes (Signed)
CSN: [id number]     Arrival date & time 06/10/14  1029 History  This chart was scribed for Joycie PeekBenjamin Azaliyah Kennard, PA-C, working with Arby BarretteMarcy Pfeiffer, MD by Chestine SporeSoijett Blue, ED Scribe. The patient was seen in room TR10C/TR10C at 11:23 AM.    Chief Complaint  Patient presents with  . Asthma     The history is provided by the patient. No language interpreter was used.   HPI Comments: Karen Lester is a 28 y.o. female with a hx of bronchitis and asthma who presents to the Emergency Department complaining of asthma onset 2 weeks. She was not able to get a refill of her inhaler and that is why she came here. When she was seen for these symptoms before she reports she was given an albuterol inhaler and Tussionex. She states that she is having intermittent associated symptoms of SOB, congestion.  She denies abdominal pain and any other symptoms. No other modifying factors  Past Medical History  Diagnosis Date  . Herpes   . Urinary tract infection   . ADHD (attention deficit hyperactivity disorder)   . Bipolar 1 disorder   . Anxiety   . Asthma   . Abnormal Pap smear     had colpo  . Depression     doing fine, not currently on meds  . Vaginal Pap smear, abnormal    Past Surgical History  Procedure Laterality Date  . Induced abortion    . Colposcopy    . Tubal ligation Bilateral 08/04/2013    Procedure: POST PARTUM TUBAL LIGATION;  Surgeon: Lesly DukesKelly H Leggett, MD;  Location: [facility];  Service: Gynecology;  Laterality: Bilateral;   Family History  Problem Relation Age of Onset  . Hypertension Mother   . Mental retardation Brother     down's syndrome  . Asthma Son    History  Substance Use Topics  . Smoking status: Former Smoker -- 0.25 packs/day for 16 years    Types: Cigarettes    Quit date: 06/13/2013  . Smokeless tobacco: Never Used  . Alcohol Use: No     Comment: OCcas.   OB History    Gravida Para Term Preterm AB TAB SAB Ectopic Multiple Living   6 5 5  1 1    5      Review of  Systems  HENT: Positive for congestion.   Respiratory: Positive for shortness of breath.   Gastrointestinal: Negative for abdominal pain.  All other systems reviewed and are negative.     Allergies  Review of patient's allergies indicates no known allergies.  Home Medications   Prior to Admission medications   Medication Sig Start Date End Date Taking? Authorizing Provider  albuterol (PROVENTIL HFA;VENTOLIN HFA) 108 (90 BASE) MCG/ACT inhaler Inhale 1-2 puffs into the lungs every 6 (six) hours as needed for wheezing or shortness of breath (rescue).    Historical Provider, MD  meloxicam (MOBIC) 15 MG tablet Take 1 tablet (15 mg total) by mouth daily. 04/18/14   Kaitlyn Szekalski, PA-C   BP 127/78 mmHg  Pulse 89  Temp(Src) 97.8 F (36.6 C) (Oral)  Resp 18  SpO2 100%  LMP 06/09/2014  Physical Exam  Constitutional: She is oriented to person, place, and time. She appears well-developed and well-nourished. No distress.  HENT:  Head: Normocephalic and atraumatic.  Eyes: EOM are normal.  Neck: Neck supple. No tracheal deviation present.  Cardiovascular: Normal rate, regular rhythm and normal heart sounds.  Exam reveals no gallop and no friction rub.  No murmur heard. Pulmonary/Chest: Effort normal and breath sounds normal. No respiratory distress. She has no wheezes. She has no rales.  Musculoskeletal: Normal range of motion.  Lymphadenopathy:    She has no cervical adenopathy.  Neurological: She is alert and oriented to person, place, and time.  Skin: Skin is warm and dry.  Psychiatric: She has a normal mood and affect. Her behavior is normal.  Nursing note and vitals reviewed.   ED Course  Procedures (including critical care time) DIAGNOSTIC STUDIES: Oxygen Saturation is 100% on room air, normal by my interpretation.    COORDINATION OF CARE: 11:28 AM-Discussed treatment plan which includes CXR, UA, and breathing treatment with pt at bedside and pt agreed to plan.   Labs  Review Labs Reviewed  POC URINE PREG, ED    Imaging Review Dg Chest 2 View  06/10/2014   CLINICAL DATA:  Cough, asthma, bronchitis  EXAM: CHEST  2 VIEW  COMPARISON:  11/05/2013  FINDINGS: Lungs are clear.  No pleural effusion or pneumothorax.  Heart is normal in size.  Visualized osseous structures are within normal limits.  IMPRESSION: Normal chest radiographs.   Electronically Signed   By: Charline BillsSriyesh  Krishnan M.D.   On: 06/10/2014 12:43     EKG Interpretation None      MDM  Vitals stable - WNL -afebrile -sats 100% Pt resting comfortably in ED.  Patient is adamant that she needs to receive hydrocodone syrup for her chest discomfort due to cough. Says she will follow-up with her primary care in order to receive refills of this medication. Patient agrees that she will no longer receive this medication in the ED and will follow-up with PCP in order to receive refills. PE--not concerning further acute or emergent pathology. Benign lung exam. Imaging- CXR shows no acute cardiopulmonary pathology Will DC with 40ml Tussionex and inhaler. Refuses any other medications. Discussed f/u with PCP and return precautions, pt very amenable to plan.   Final diagnoses:  Cough      I personally performed the services described in this documentation, which was scribed in my presence. The recorded information has been reviewed and is accurate.    Earle GellBenjamin W Crooksvilleartner, PA-C 06/10/14 1927  Arby BarretteMarcy Pfeiffer, MD 06/11/14 [phone]

## 2014-06-10 NOTE — ED Notes (Signed)
Pt reports having asthma and bronchitis, has been out of inhaler and nebulizer x 1 month. Pt reports productive cough. Airway intact, spo2 100% at triage.

## 2014-06-10 NOTE — ED Notes (Signed)
Declined W/C at D/C and was escorted to lobby by RN. 

## 2014-06-24 ENCOUNTER — Encounter (HOSPITAL_COMMUNITY): Payer: Self-pay | Admitting: Emergency Medicine

## 2014-06-24 ENCOUNTER — Emergency Department (HOSPITAL_COMMUNITY)
Admission: EM | Admit: 2014-06-24 | Discharge: 2014-06-24 | Disposition: A | Discharge status: Home / Self Care | Payer: No Typology Code available for payment source | Attending: Emergency Medicine | Admitting: Emergency Medicine

## 2014-06-24 ENCOUNTER — Emergency Department (HOSPITAL_COMMUNITY): Payer: No Typology Code available for payment source

## 2014-06-24 DIAGNOSIS — Z8619 Personal history of other infectious and parasitic diseases: Secondary | ICD-10-CM | POA: Diagnosis not present

## 2014-06-24 DIAGNOSIS — S0990XA Unspecified injury of head, initial encounter: Secondary | ICD-10-CM | POA: Diagnosis not present

## 2014-06-24 DIAGNOSIS — Y998 Other external cause status: Secondary | ICD-10-CM | POA: Insufficient documentation

## 2014-06-24 DIAGNOSIS — Y92410 Unspecified street and highway as the place of occurrence of the external cause: Secondary | ICD-10-CM | POA: Diagnosis not present

## 2014-06-24 DIAGNOSIS — Y9389 Activity, other specified: Secondary | ICD-10-CM | POA: Insufficient documentation

## 2014-06-24 DIAGNOSIS — S8992XA Unspecified injury of left lower leg, initial encounter: Secondary | ICD-10-CM | POA: Diagnosis present

## 2014-06-24 DIAGNOSIS — J45909 Unspecified asthma, uncomplicated: Secondary | ICD-10-CM | POA: Insufficient documentation

## 2014-06-24 DIAGNOSIS — S86912A Strain of unspecified muscle(s) and tendon(s) at lower leg level, left leg, initial encounter: Secondary | ICD-10-CM | POA: Insufficient documentation

## 2014-06-24 DIAGNOSIS — Z8659 Personal history of other mental and behavioral disorders: Secondary | ICD-10-CM | POA: Diagnosis not present

## 2014-06-24 DIAGNOSIS — M25562 Pain in left knee: Secondary | ICD-10-CM

## 2014-06-24 DIAGNOSIS — T148XXA Other injury of unspecified body region, initial encounter: Secondary | ICD-10-CM

## 2014-06-24 DIAGNOSIS — Z79899 Other long term (current) drug therapy: Secondary | ICD-10-CM | POA: Diagnosis not present

## 2014-06-24 DIAGNOSIS — Z87891 Personal history of nicotine dependence: Secondary | ICD-10-CM | POA: Diagnosis not present

## 2014-06-24 DIAGNOSIS — Z87440 Personal history of urinary (tract) infections: Secondary | ICD-10-CM | POA: Insufficient documentation

## 2014-06-24 DIAGNOSIS — Z791 Long term (current) use of non-steroidal anti-inflammatories (NSAID): Secondary | ICD-10-CM | POA: Diagnosis not present

## 2014-06-24 MED ORDER — IBUPROFEN 100 MG/5ML PO SUSP
800.0000 mg | Freq: Once | ORAL | Status: DC
  Filled 2014-06-24: qty 40

## 2014-06-24 MED ORDER — ACETAMINOPHEN-CODEINE 120-12 MG/5ML PO SUSP: 10.0000 mL | Freq: Four times a day (QID) | ORAL | Status: DC | PRN

## 2014-06-24 MED ORDER — IBUPROFEN 800 MG PO TABS: 800.0000 mg | ORAL_TABLET | Freq: Once | ORAL | Status: DC

## 2014-06-24 NOTE — ED Notes (Signed)
Called patient for the second time and patient is still in the cafeteria.
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
## 2014-06-24 NOTE — ED Notes (Signed)
Pt had been advised that her liquid motrin has to come from pharmacy. Pt not in room. Medication not given
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
## 2014-06-24 NOTE — ED Notes (Signed)
Called to treatment room. Family stated that pt is in cafeteria

## 2014-06-24 NOTE — ED Notes (Signed)
Pt that she is unable to walk and will need a wheelchair to go to xray. Pt reminded to stay in room. Unable to locate pt on three occasions. Pt was in restroom and visiting friends in other rooms. Did not apply ice pack to knee as directed

## 2014-06-24 NOTE — ED Notes (Signed)
Pt back in to dept and stating that no one ever came back into her room to discharge her. AnnMarie, RN stated and documented that she went in to patient's room multiple times and pt was not in room. Pt stated she "didn't leave" but then stated she "was on the other side of the hospital." Pt had been discharged from the system due to not being in room for over . Pt un-discharged from computer system. Reprinted AVS. NP stated she would not reprint Rx as the pt had already been discharged and pt could take ibuprofen OTC as needed. Pt informed of this information and became agitated and yelling in triage that she is going to come back tomorrow and start the whole process over. Pt yelling as she is walking out to lobby. Refused AVS.

## 2014-06-30 ENCOUNTER — Encounter (HOSPITAL_COMMUNITY): Payer: Self-pay | Admitting: Emergency Medicine

## 2014-06-30 ENCOUNTER — Emergency Department (HOSPITAL_COMMUNITY)
Admission: EM | Admit: 2014-06-30 | Discharge: 2014-07-01 | Disposition: A | Discharge status: Home / Self Care | Payer: No Typology Code available for payment source | Attending: Emergency Medicine | Admitting: Emergency Medicine

## 2014-06-30 DIAGNOSIS — Z720 Tobacco use: Secondary | ICD-10-CM | POA: Diagnosis not present

## 2014-06-30 DIAGNOSIS — N898 Other specified noninflammatory disorders of vagina: Secondary | ICD-10-CM | POA: Diagnosis present

## 2014-06-30 DIAGNOSIS — Z791 Long term (current) use of non-steroidal anti-inflammatories (NSAID): Secondary | ICD-10-CM | POA: Diagnosis not present

## 2014-06-30 DIAGNOSIS — M25512 Pain in left shoulder: Secondary | ICD-10-CM | POA: Diagnosis not present

## 2014-06-30 DIAGNOSIS — Z8659 Personal history of other mental and behavioral disorders: Secondary | ICD-10-CM | POA: Diagnosis not present

## 2014-06-30 DIAGNOSIS — G8911 Acute pain due to trauma: Secondary | ICD-10-CM | POA: Diagnosis not present

## 2014-06-30 DIAGNOSIS — Z8619 Personal history of other infectious and parasitic diseases: Secondary | ICD-10-CM | POA: Diagnosis not present

## 2014-06-30 DIAGNOSIS — Z79899 Other long term (current) drug therapy: Secondary | ICD-10-CM | POA: Diagnosis not present

## 2014-06-30 DIAGNOSIS — N760 Acute vaginitis: Secondary | ICD-10-CM | POA: Insufficient documentation

## 2014-06-30 DIAGNOSIS — Z87440 Personal history of urinary (tract) infections: Secondary | ICD-10-CM | POA: Diagnosis not present

## 2014-06-30 DIAGNOSIS — B9689 Other specified bacterial agents as the cause of diseases classified elsewhere: Secondary | ICD-10-CM
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
## 2014-07-01 ENCOUNTER — Emergency Department (HOSPITAL_COMMUNITY): Payer: No Typology Code available for payment source

## 2014-07-01 LAB — WET PREP, GENITAL
Trich, Wet Prep: NONE SEEN
Yeast Wet Prep HPF POC: NONE SEEN

## 2014-07-01 LAB — HIV ANTIBODY (ROUTINE TESTING W REFLEX): HIV 1&2 Ab, 4th Generation: NONREACTIVE

## 2014-07-01 MED ORDER — METHOCARBAMOL 1000 MG/10ML IJ SOLN
1000.0000 mg | Freq: Once | INTRAMUSCULAR | Status: DC
  Filled 2014-07-01: qty 10

## 2014-07-01 MED ORDER — KETOROLAC TROMETHAMINE 60 MG/2ML IM SOLN
60.0000 mg | Freq: Once | INTRAMUSCULAR | Status: AC
  Administered 2014-07-01: 60 mg via INTRAMUSCULAR
  Filled 2014-07-01: qty 2

## 2014-07-01 MED ORDER — KETOROLAC TROMETHAMINE 10 MG PO TABS: 10.0000 mg | ORAL_TABLET | Freq: Four times a day (QID) | ORAL | Status: DC | PRN

## 2014-07-01 MED ORDER — CEFTRIAXONE SODIUM 250 MG IJ SOLR
250.0000 mg | Freq: Once | INTRAMUSCULAR | Status: AC
  Administered 2014-07-01: 250 mg via INTRAMUSCULAR
  Filled 2014-07-01: qty 250

## 2014-07-01 MED ORDER — AZITHROMYCIN 250 MG PO TABS
1000.0000 mg | ORAL_TABLET | Freq: Once | ORAL | Status: AC
  Administered 2014-07-01: 1000 mg via ORAL
  Filled 2014-07-01: qty 4

## 2014-07-01 MED ORDER — STERILE WATER FOR INJECTION IJ SOLN
INTRAMUSCULAR | Status: AC
  Administered 2014-07-01: 1 mL
  Filled 2014-07-01: qty 10

## 2014-07-01 MED ORDER — METHOCARBAMOL 500 MG PO TABS: 500.0000 mg | ORAL_TABLET | Freq: Two times a day (BID) | ORAL | Status: DC | PRN

## 2014-07-01 MED ORDER — METHOCARBAMOL 500 MG PO TABS
1000.0000 mg | ORAL_TABLET | Freq: Once | ORAL | Status: AC
  Administered 2014-07-01: 1000 mg via ORAL
  Filled 2014-07-01: qty 2

## 2014-07-01 NOTE — Discharge Instructions (Signed)
You were evaluated in the ED today for your shoulder pain and vaginal discharge. There does not appear to be any emergent causes for your symptoms at this time. He may take the Toradol for pain and inflammation the you may experience with her shoulder. You were treated with Rocephin and azithromycin in the ED for prophylactic STI treatment. Please follow-up with your primary care for further evaluation and management of your symptoms. Return to ED for worsening symptoms
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
## 2014-07-03 LAB — GC/CHLAMYDIA PROBE AMP
CT Probe RNA: NEGATIVE
GC PROBE AMP APTIMA: NEGATIVE

## 2015-06-21 ENCOUNTER — Encounter (HOSPITAL_COMMUNITY): Payer: Self-pay | Admitting: Emergency Medicine

## 2015-06-21 ENCOUNTER — Emergency Department (HOSPITAL_COMMUNITY)
Admission: EM | Admit: 2015-06-21 | Discharge: 2015-06-21 | Discharge status: Against Medical Advice | Payer: Medicare Other | Attending: Emergency Medicine | Admitting: Emergency Medicine

## 2015-06-21 DIAGNOSIS — R079 Chest pain, unspecified: Secondary | ICD-10-CM | POA: Diagnosis not present

## 2015-06-21 DIAGNOSIS — J45909 Unspecified asthma, uncomplicated: Secondary | ICD-10-CM | POA: Diagnosis not present

## 2015-06-21 DIAGNOSIS — R0602 Shortness of breath: Secondary | ICD-10-CM | POA: Insufficient documentation

## 2015-06-21 DIAGNOSIS — R05 Cough: Secondary | ICD-10-CM | POA: Diagnosis not present

## 2015-06-21 LAB — URINALYSIS, ROUTINE W REFLEX MICROSCOPIC
BILIRUBIN URINE: NEGATIVE
Glucose, UA: NEGATIVE mg/dL
HGB URINE DIPSTICK: NEGATIVE
Ketones, ur: NEGATIVE mg/dL
NITRITE: NEGATIVE
PROTEIN: NEGATIVE mg/dL
SPECIFIC GRAVITY, URINE: 1.017 (ref 1.005–1.030)
pH: 6 (ref 5.0–8.0)

## 2015-06-21 LAB — URINE MICROSCOPIC-ADD ON

## 2015-06-21 LAB — POC URINE PREG, ED: Preg Test, Ur: NEGATIVE

## 2015-06-21 NOTE — ED Notes (Addendum)
Pt states for the last two weeks she has been having a dry cough and feeling like she has wheezing at night when she lays down. Pt shortness of breath. Pt states she has had chest pain when coughing but not at this time. Pt states, " she just feels like she needs her inhaler prescription refilled. " pt also states she has a weird sensation" after urination but denies  any abdominal pain or vaginal discharge.

## 2015-09-16 ENCOUNTER — Emergency Department (HOSPITAL_COMMUNITY)
Admission: EM | Admit: 2015-09-16 | Discharge: 2015-09-16 | Disposition: A | Discharge status: Home / Self Care | Payer: Medicare Other | Attending: Emergency Medicine | Admitting: Emergency Medicine

## 2015-09-16 ENCOUNTER — Encounter (HOSPITAL_COMMUNITY): Payer: Self-pay | Admitting: *Deleted

## 2015-09-16 DIAGNOSIS — Z8619 Personal history of other infectious and parasitic diseases: Secondary | ICD-10-CM | POA: Insufficient documentation

## 2015-09-16 DIAGNOSIS — J45909 Unspecified asthma, uncomplicated: Secondary | ICD-10-CM | POA: Diagnosis not present

## 2015-09-16 DIAGNOSIS — R05 Cough: Secondary | ICD-10-CM | POA: Diagnosis present

## 2015-09-16 DIAGNOSIS — F17210 Nicotine dependence, cigarettes, uncomplicated: Secondary | ICD-10-CM | POA: Insufficient documentation

## 2015-09-16 DIAGNOSIS — Z3202 Encounter for pregnancy test, result negative: Secondary | ICD-10-CM | POA: Diagnosis not present

## 2015-09-16 DIAGNOSIS — N390 Urinary tract infection, site not specified: Secondary | ICD-10-CM | POA: Insufficient documentation

## 2015-09-16 DIAGNOSIS — Z8659 Personal history of other mental and behavioral disorders: Secondary | ICD-10-CM | POA: Diagnosis not present

## 2015-09-16 DIAGNOSIS — J069 Acute upper respiratory infection, unspecified: Secondary | ICD-10-CM

## 2015-09-16 LAB — URINALYSIS, ROUTINE W REFLEX MICROSCOPIC
BILIRUBIN URINE: NEGATIVE
GLUCOSE, UA: NEGATIVE mg/dL
HGB URINE DIPSTICK: NEGATIVE
Ketones, ur: NEGATIVE mg/dL
Nitrite: POSITIVE — AB
PH: 6 (ref 5.0–8.0)
Protein, ur: NEGATIVE mg/dL
SPECIFIC GRAVITY, URINE: 1.035 — AB (ref 1.005–1.030)

## 2015-09-16 LAB — URINE MICROSCOPIC-ADD ON

## 2015-09-16 LAB — PREGNANCY, URINE: PREG TEST UR: NEGATIVE

## 2015-09-16 MED ORDER — ALBUTEROL SULFATE HFA 108 (90 BASE) MCG/ACT IN AERS: 2.0000 | INHALATION_SPRAY | RESPIRATORY_TRACT | Status: DC | PRN

## 2015-09-16 MED ORDER — CEPHALEXIN 500 MG PO CAPS
500.0000 mg | ORAL_CAPSULE | Freq: Three times a day (TID) | ORAL | Status: DC
Start: 2015-09-16 — End: 2017-09-08

## 2015-09-16 MED ORDER — GUAIFENESIN-CODEINE 100-10 MG/5ML PO SOLN
5.0000 mL | Freq: Four times a day (QID) | ORAL | Status: DC | PRN
Start: 2015-09-16 — End: 2017-09-08

## 2015-09-16 NOTE — ED Notes (Signed)
Pt reports cough, head congestion, runny nose.  Pt also reports dx with UTI beginning of this month and never filled her rx for abx, but used her "homegirl's" abx but it's not getting better.  Pt is also c/o of having "deep" sweats.

## 2015-09-16 NOTE — ED Notes (Signed)
Patient states that she was given a Dx of UTI and a prescription for antibiotics that cost $80 and was unable to fill the prescription.  Patient also states that she has a history of bronchitis and can not afford a rescue inhaler.

## 2015-09-16 NOTE — ED Notes (Signed)
Patient is alert and oriented x3.  She was given DC instructions and follow up visit instructions.  Patient gave verbal understanding. She was DC ambulatory under her own power to home.  V/S stable.  He was not showing any signs of distress on DC 

## 2015-09-16 NOTE — ED Provider Notes (Signed)
CSN: [id number]     Arrival date & time 09/16/15  0831 History   First MD Initiated Contact with Patient 09/16/15 0940     Chief Complaint  Patient presents with  . Cough  . Nasal Congestion     (Consider location/radiation/quality/duration/timing/severity/associated sxs/prior Treatment)   HPI   Karen Lester is a(n) 30 y.o. female who presents to the ED with URI and UTI complaints. She states that a few weeks ago she was seen in [place] and diagnosed with a UTI. She states that her antibiotics, about $80 and she is unable to obtain them. She complains of continued frequency and urgency of urination. The patient also states that 3 days ago she developed a cough with nasal congestion, rhinorrhea and headaches. She states that she may have had a fever 2 days ago, but that she has not had one since. She has been using over-the-counter daytime and nighttime Mucinex cold relief with improvement of her symptoms. She has a history of asthma and states that she is wheezing and coughing frequently at night time. She does not have a current inhaler. She is a current daily smoker, but states "I am trying to quit."  Past Medical History  Diagnosis Date  . Herpes   . Urinary tract infection   . ADHD (attention deficit hyperactivity disorder)   . Bipolar 1 disorder (HCC)   . Anxiety   . Asthma   . Abnormal Pap smear     had colpo  . Depression     doing fine, not currently on meds  . Vaginal Pap smear, abnormal    Past Surgical History  Procedure Laterality Date  . Induced abortion    . Colposcopy    . Tubal ligation Bilateral 08/04/2013    Procedure: POST PARTUM TUBAL LIGATION;  Surgeon: Lesly DukesKelly H Leggett, MD;  Location: [facility];  Service: Gynecology;  Laterality: Bilateral;   Family History  Problem Relation Age of Onset  . Hypertension Mother   . Mental retardation Brother     down's syndrome  . Asthma Son    Social History  Substance Use Topics  . Smoking status: Current Every Day  Smoker -- 0.25 packs/day for 16 years    Types: Cigarettes    Last Attempt to Quit: 06/13/2013  . Smokeless tobacco: Never Used  . Alcohol Use: No     Comment: OCcas.   OB History    Gravida Para Term Preterm AB TAB SAB Ectopic Multiple Living   6 5 5  1 1    5      Review of Systems  Ten systems reviewed and are negative for acute change, except as noted in the HPI.    Allergies  Review of patient's allergies indicates no known allergies.  Home Medications   Prior to Admission medications   Medication Sig Start Date End Date Taking? Authorizing Provider  Phenylephrine-DM-GG-APAP (TYLENOL COLD MULTI-SYMPTOM) 5-10-200-325 MG TABS Take 2 tablets by mouth every 6 (six) hours as needed (for cold symptoms).   Yes Historical Provider, MD  acetaminophen-codeine 120-12 MG/5ML suspension Take 10 mLs by mouth every 6 (six) hours as needed for pain. Patient not taking: Reported on 09/16/2015 06/24/14   Teressa LowerVrinda Pickering, NP  albuterol (PROVENTIL HFA;VENTOLIN HFA) 108 (90 BASE) MCG/ACT inhaler Inhale 2 puffs into the lungs every 2 (two) hours as needed for wheezing or shortness of breath (cough). Patient not taking: Reported on 09/16/2015 06/10/14   Joycie PeekBenjamin Cartner, PA-C  chlorpheniramine-HYDROcodone [facility](TUSSIONEX PENNKINETIC ER) 10-8 MG/5ML  LQCR Take 5 mLs by mouth every 12 (twelve) hours as needed for cough (Cough). Patient not taking: Reported on 09/16/2015 06/10/14   Joycie Peek, PA-C  ketorolac (TORADOL) 10 MG tablet Take 1 tablet (10 mg total) by mouth every 6 (six) hours as needed. Patient not taking: Reported on 09/16/2015 07/01/14   Joycie Peek, PA-C  meloxicam (MOBIC) 15 MG tablet Take 1 tablet (15 mg total) by mouth daily. Patient not taking: Reported on 09/16/2015 04/18/14   Emilia Beck, PA-C  methocarbamol (ROBAXIN) 500 MG tablet Take 1 tablet (500 mg total) by mouth 2 (two) times daily as needed for muscle spasms. Patient not taking: Reported on 09/16/2015 07/01/14   Joycie Peek, PA-C   BP 121/100 mmHg  Pulse 69  Temp(Src) 98.3 F (36.8 C) (Oral)  Resp 18  SpO2 99% Physical Exam  Constitutional: She is oriented to person, place, and time. She appears well-developed and well-nourished. No distress.  HENT:  Head: Normocephalic and atraumatic.  Eyes: Conjunctivae are normal. No scleral icterus.  Neck: Normal range of motion.  Cardiovascular: Normal rate, regular rhythm and normal heart sounds.  Exam reveals no gallop and no friction rub.   No murmur heard. Pulmonary/Chest: Effort normal and breath sounds normal. No respiratory distress.  Abdominal: Soft. Bowel sounds are normal. She exhibits no distension and no mass. There is no tenderness. There is no guarding.  Neurological: She is alert and oriented to person, place, and time.  Skin: Skin is warm and dry. She is not diaphoretic.  Psychiatric:  Patient is hyperactive, conversation is at times somewhat tangential. Her speech is somewhat pressured    ED Course  Procedures (including critical care time) Labs Review Labs Reviewed  URINALYSIS, ROUTINE W REFLEX MICROSCOPIC (NOT AT [facility]) - Abnormal; Notable for the following:    APPearance CLOUDY (*)    Specific Gravity, Urine 1.035 (*)    Nitrite POSITIVE (*)    Leukocytes, UA SMALL (*)    All other components within normal limits  URINE MICROSCOPIC-ADD ON - Abnormal; Notable for the following:    Squamous Epithelial / LPF 6-30 (*)    Bacteria, UA MANY (*)    All other components within normal limits  PREGNANCY, URINE    Imaging Review No results found. I have personally reviewed and evaluated these images and lab results as part of my medical decision-making.   EKG Interpretation None      MDM   Final diagnoses:  URI (upper respiratory infection)  UTI (lower urinary tract infection)    Pt has been diagnosed with a UTI. Pt is afebrile, no CVA tenderness, normotensive, and denies N/V. Pt to be dc home with antibiotics and  instructions to follow up with PCP if symptoms persist.  Patients symptoms are consistent with URI, likely viral etiology. Discussed that antibiotics are not indicated for viral infections. Pt will be discharged with symptomatic treatment.  Verbalizes understanding and is agreeable with plan. Pt is hemodynamically stable & in NAD prior to dc.     Arthor Captain, PA-C 09/16/15 1227  Linwood Dibbles, MD 09/20/15 [phone]

## 2015-09-16 NOTE — Discharge Instructions (Signed)
Upper Respiratory Infection, Adult °Most upper respiratory infections (URIs) are a viral infection of the air passages leading to the lungs. A URI affects the nose, throat, and upper air passages. The most common type of URI is nasopharyngitis and is typically referred to as "the common cold." °URIs run their course and usually go away on their own. Most of the time, a URI does not require medical attention, but sometimes a bacterial infection in the upper airways can follow a viral infection. This is called a secondary infection. Sinus and middle ear infections are common types of secondary upper respiratory infections. °Bacterial pneumonia can also complicate a URI. A URI can worsen asthma and chronic obstructive pulmonary disease (COPD). Sometimes, these complications can require emergency medical care and may be life threatening.  °CAUSES °Almost all URIs are caused by viruses. A virus is a type of germ and can spread from one person to another.  °RISKS FACTORS °You may be at risk for a URI if:  °· You smoke.   °· You have chronic heart or lung disease. °· You have a weakened defense (immune) system.   °· You are very young or very old.   °· You have nasal allergies or asthma. °· You work in crowded or poorly ventilated areas. °· You work in health care facilities or schools. °SIGNS AND SYMPTOMS  °Symptoms typically develop 2-3 days after you come in contact with a cold virus. Most viral URIs last 7-10 days. However, viral URIs from the influenza virus (flu virus) can last 14-18 days and are typically more severe. Symptoms may include:  °· Runny or stuffy (congested) nose.   °· Sneezing.   °· Cough.   °· Sore throat.   °· Headache.   °· Fatigue.   °· Fever.   °· Loss of appetite.   °· Pain in your forehead, behind your eyes, and over your cheekbones (sinus pain). °· Muscle aches.   °DIAGNOSIS  °Your health care provider may diagnose a URI by: °· Physical exam. °· Tests to check that your symptoms are not due to  another condition such as: °· Strep throat. °· Sinusitis. °· Pneumonia. °· Asthma. °TREATMENT  °A URI goes away on its own with time. It cannot be cured with medicines, but medicines may be prescribed or recommended to relieve symptoms. Medicines may help: °· Reduce your fever. °· Reduce your cough. °· Relieve nasal congestion. °HOME CARE INSTRUCTIONS  °· Take medicines only as directed by your health care provider.   °· Gargle warm saltwater or take cough drops to comfort your throat as directed by your health care provider. °· Use a warm mist humidifier or inhale steam from a shower to increase air moisture. This may make it easier to breathe. °· Drink enough fluid to keep your urine clear or pale yellow.   °· Eat soups and other clear broths and maintain good nutrition.   °· Rest as needed.   °· Return to work when your temperature has returned to normal or as your health care provider advises. You may need to stay home longer to avoid infecting others. You can also use a face mask and careful hand washing to prevent spread of the virus. °· Increase the usage of your inhaler if you have asthma.   °· Do not use any tobacco products, including cigarettes, chewing tobacco, or electronic cigarettes. If you need help quitting, ask your health care provider. °PREVENTION  °The best way to protect yourself from getting a cold is to practice good hygiene.  °· Avoid oral or hand contact with people with cold   symptoms.   °· Wash your hands often if contact occurs.   °There is no clear evidence that vitamin C, vitamin E, echinacea, or exercise reduces the chance of developing a cold. However, it is always recommended to get plenty of rest, exercise, and practice good nutrition.  °SEEK MEDICAL CARE IF:  °· You are getting worse rather than better.   °· Your symptoms are not controlled by medicine.   °· You have chills. °· You have worsening shortness of breath. °· You have brown or red mucus. °· You have yellow or brown nasal  discharge. °· You have pain in your face, especially when you bend forward. °· You have a fever. °· You have swollen neck glands. °· You have pain while swallowing. °· You have white areas in the back of your throat. °SEEK IMMEDIATE MEDICAL CARE IF:  °· You have severe or persistent: °¨ Headache. °¨ Ear pain. °¨ Sinus pain. °¨ Chest pain. °· You have chronic lung disease and any of the following: °¨ Wheezing. °¨ Prolonged cough. °¨ Coughing up blood. °¨ A change in your usual mucus. °· You have a stiff neck. °· You have changes in your: °¨ Vision. °¨ Hearing. °¨ Thinking. °¨ Mood. °MAKE SURE YOU:  °· Understand these instructions. °· Will watch your condition. °· Will get help right away if you are not doing well or get worse. °  °This information is not intended to replace advice given to you by your health care provider. Make sure you discuss any questions you have with your health care provider. °  °Document Released: 12/24/2000 Document Revised: 11/14/2014 Document Reviewed: 10/05/2013 °Elsevier Interactive Patient Education ©2016 [employer] Inc. ° °Urinary Tract Infection °Urinary tract infections (UTIs) can develop anywhere along your urinary tract. Your urinary tract is your body's drainage system for removing wastes and extra water. Your urinary tract includes two kidneys, two ureters, a bladder, and a urethra. Your kidneys are a pair of bean-shaped organs. Each kidney is about the size of your fist. They are located below your ribs, one on each side of your spine. °CAUSES °Infections are caused by microbes, which are microscopic organisms, including fungi, viruses, and bacteria. These organisms are so small that they can only be seen through a microscope. Bacteria are the microbes that most commonly cause UTIs. °SYMPTOMS  °Symptoms of UTIs may vary by age and gender of the patient and by the location of the infection. Symptoms in young women typically include a frequent and intense urge to urinate and a  painful, burning feeling in the bladder or urethra during urination. Older women and men are more likely to be tired, shaky, and weak and have muscle aches and abdominal pain. A fever may mean the infection is in your kidneys. Other symptoms of a kidney infection include pain in your back or sides below the ribs, nausea, and vomiting. °DIAGNOSIS °To diagnose a UTI, your caregiver will ask you about your symptoms. Your caregiver will also ask you to provide a urine sample. The urine sample will be tested for bacteria and white blood cells. White blood cells are made by your body to help fight infection. °TREATMENT  °Typically, UTIs can be treated with medication. Because most UTIs are caused by a bacterial infection, they usually can be treated with the use of antibiotics. The choice of antibiotic and length of treatment depend on your symptoms and the type of bacteria causing your infection. °HOME CARE INSTRUCTIONS °· If you were prescribed antibiotics, take them exactly as your   caregiver instructs you. Finish the medication even if you feel better after you have only taken some of the medication. °· Drink enough water and fluids to keep your urine clear or pale yellow. °· Avoid caffeine, tea, and carbonated beverages. They tend to irritate your bladder. °· Empty your bladder often. Avoid holding urine for long periods of time. °· Empty your bladder before and after sexual intercourse. °· After a bowel movement, women should cleanse from front to back. Use each tissue only once. °SEEK MEDICAL CARE IF:  °· You have back pain. °· You develop a fever. °· Your symptoms do not begin to resolve within 3 days. °SEEK IMMEDIATE MEDICAL CARE IF:  °· You have severe back pain or lower abdominal pain. °· You develop chills. °· You have nausea or vomiting. °· You have continued burning or discomfort with urination. °MAKE SURE YOU:  °· Understand these instructions. °· Will watch your condition. °· Will get help right away if you  are not doing well or get worse. °  °This information is not intended to replace advice given to you by your health care provider. Make sure you discuss any questions you have with your health care provider. °  °Document Released: 04/09/2005 Document Revised: 03/21/2015 Document Reviewed: 08/08/2011 °Elsevier Interactive Patient Education ©2016 Elsevier Inc. ° °

## 2017-01-23 DIAGNOSIS — F330 Major depressive disorder, recurrent, mild: Secondary | ICD-10-CM | POA: Diagnosis not present

## 2017-01-27 ENCOUNTER — Ambulatory Visit: Payer: Medicare Other | Admitting: Obstetrics and Gynecology

## 2017-06-28 ENCOUNTER — Other Ambulatory Visit: Payer: Self-pay

## 2017-06-28 ENCOUNTER — Encounter (HOSPITAL_COMMUNITY): Payer: Self-pay | Admitting: *Deleted

## 2017-06-28 ENCOUNTER — Emergency Department (HOSPITAL_COMMUNITY)
Admission: EM | Admit: 2017-06-28 | Discharge: 2017-06-28 | Disposition: A | Discharge status: Home / Self Care | Payer: Medicare Other | Attending: Emergency Medicine | Admitting: Emergency Medicine

## 2017-06-28 DIAGNOSIS — K0889 Other specified disorders of teeth and supporting structures: Secondary | ICD-10-CM | POA: Diagnosis not present

## 2017-06-28 DIAGNOSIS — F17210 Nicotine dependence, cigarettes, uncomplicated: Secondary | ICD-10-CM | POA: Diagnosis not present

## 2017-06-28 DIAGNOSIS — J45909 Unspecified asthma, uncomplicated: Secondary | ICD-10-CM | POA: Diagnosis not present

## 2017-06-28 DIAGNOSIS — R05 Cough: Secondary | ICD-10-CM

## 2017-06-28 DIAGNOSIS — G8929 Other chronic pain: Secondary | ICD-10-CM | POA: Diagnosis not present

## 2017-06-28 DIAGNOSIS — Z79899 Other long term (current) drug therapy: Secondary | ICD-10-CM | POA: Diagnosis not present

## 2017-06-28 DIAGNOSIS — K089 Disorder of teeth and supporting structures, unspecified: Secondary | ICD-10-CM | POA: Insufficient documentation

## 2017-06-28 DIAGNOSIS — R059 Cough, unspecified: Secondary | ICD-10-CM

## 2017-06-28 MED ORDER — CYCLOBENZAPRINE HCL 10 MG PO TABS
10.0000 mg | ORAL_TABLET | Freq: Once | ORAL | Status: AC
  Administered 2017-06-28: 10 mg via ORAL
  Filled 2017-06-28: qty 1

## 2017-06-28 MED ORDER — GUAIFENESIN 100 MG/5ML PO SYRP: 200.0000 mg | ORAL_SOLUTION | ORAL | 0 refills | Status: DC | PRN

## 2017-06-28 MED ORDER — ALBUTEROL SULFATE HFA 108 (90 BASE) MCG/ACT IN AERS: 2.0000 | INHALATION_SPRAY | RESPIRATORY_TRACT | 0 refills | Status: DC | PRN

## 2017-06-28 MED ORDER — CYCLOBENZAPRINE HCL 10 MG PO TABS: 10.0000 mg | ORAL_TABLET | Freq: Two times a day (BID) | ORAL | 0 refills | Status: DC | PRN

## 2017-06-28 MED ORDER — CLINDAMYCIN HCL 300 MG PO CAPS: 300.0000 mg | ORAL_CAPSULE | Freq: Three times a day (TID) | ORAL | 0 refills | Status: AC

## 2017-06-28 MED ORDER — ALBUTEROL SULFATE HFA 108 (90 BASE) MCG/ACT IN AERS
2.0000 | INHALATION_SPRAY | Freq: Once | RESPIRATORY_TRACT | Status: AC
  Administered 2017-06-28: 2 via RESPIRATORY_TRACT
  Filled 2017-06-28: qty 6.7

## 2017-06-28 NOTE — ED Provider Notes (Signed)
[facility] Provider Note   CSN: [id number] Arrival date & time: 06/28/17  1619     History   Chief Complaint Chief Complaint  Patient presents with  . Asthma  . URI  . Dental Pain    HPI Karen Lester is a 31 y.o. female.  HPI   Cough:  Patient is a 31 year old female with a history of asthma, anxiety, ADHD, bipolar 1 disorder presenting for right upper tooth pain for several months and cough for 3 weeks.  Patient reports that she has a dry cough occasionally productive of yellow or green sputum that will keep her up at night.  Patient reports subjective fevers.  Patient reports that her illness initially began with congestion that is now resolved.  No sore throat.  No posttussive emesis.  Patient reports she has previously had Tussionex and that relieved her symptoms.  Dental Pain: Patient reports she has had right-sided upper dental pain for several months.  She says that she has had a tooth that has been eroded over time.  No surrounding erythema at this time.  Patient describes it as a dull ache that will cause her temple to throb in her lower jaw to hurt.  No lingual swelling, submandibular tenderness, difficulty breathing, or difficulty swallowing.  Patient is not currently followed by dentistry.  Past Medical History:  Diagnosis Date  . Abnormal Pap smear    had colpo  . ADHD (attention deficit hyperactivity disorder)   . Anxiety   . Asthma   . Bipolar 1 disorder (HCC)   . Depression    doing fine, not currently on meds  . Herpes   . Urinary tract infection   . Vaginal Pap smear, abnormal     Patient Active Problem List   Diagnosis Date Noted  . Pregnancy 08/03/2013  . Bipolar 1 disorder (HCC) 02/24/2012    Class: Chronic  . ADHD (attention deficit hyperactivity disorder) 02/24/2012    Class: Chronic  . Polysubstance abuse (HCC) 02/24/2012    Class: Chronic    Past Surgical History:  Procedure Laterality Date    . COLPOSCOPY    . INDUCED ABORTION    . TUBAL LIGATION Bilateral 08/04/2013   Procedure: POST PARTUM TUBAL LIGATION;  Surgeon: Lesly DukesKelly H Leggett, MD;  Location: [facility];  Service: Gynecology;  Laterality: Bilateral;    OB History    Gravida Para Term Preterm AB Living   6 5 5   1 5    SAB TAB Ectopic Multiple Live Births     1     5       Home Medications    Prior to Admission medications   Medication Sig Start Date End Date Taking? Authorizing Provider  acetaminophen-codeine 120-12 MG/5ML suspension Take 10 mLs by mouth every 6 (six) hours as needed for pain. Patient not taking: Reported on 09/16/2015 06/24/14   Teressa LowerPickering, Vrinda, NP  albuterol (PROVENTIL HFA;VENTOLIN HFA) 108 (90 Base) MCG/ACT inhaler Inhale 2 puffs into the lungs every 2 (two) hours as needed for wheezing or shortness of breath (cough). 06/28/17   Aviva KluverMurray, Ajwa Kimberley B, PA-C  cephALEXin (KEFLEX) 500 MG capsule Take 1 capsule (500 mg total) by mouth 3 (three) times daily. 09/16/15   Arthor CaptainHarris, Abigail, PA-C  chlorpheniramine-HYDROcodone (TUSSIONEX PENNKINETIC ER) 10-8 MG/5ML LQCR Take 5 mLs by mouth every 12 (twelve) hours as needed for cough (Cough). Patient not taking: Reported on 09/16/2015 06/10/14   Joycie Peekartner, Benjamin, PA-C  clindamycin (CLEOCIN) 300 MG  capsule Take 1 capsule (300 mg total) by mouth 3 (three) times daily for 5 days. 06/28/17 07/03/17  Aviva Kluver B, PA-C  cyclobenzaprine (FLEXERIL) 10 MG tablet Take 1 tablet (10 mg total) by mouth 2 (two) times daily as needed for muscle spasms. 06/28/17   Aviva Kluver B, PA-C  guaifenesin (ROBITUSSIN) 100 MG/5ML syrup Take 10 mLs (200 mg total) by mouth every 4 (four) hours as needed for cough. 06/28/17   Aviva Kluver B, PA-C  guaiFENesin-codeine 100-10 MG/5ML syrup Take 5-10 mLs by mouth every 6 (six) hours as needed for cough. 09/16/15   Arthor Captain, PA-C  ketorolac (TORADOL) 10 MG tablet Take 1 tablet (10 mg total) by mouth every 6 (six) hours as needed. Patient not  taking: Reported on 09/16/2015 07/01/14   Joycie Peek, PA-C  meloxicam (MOBIC) 15 MG tablet Take 1 tablet (15 mg total) by mouth daily. Patient not taking: Reported on 09/16/2015 04/18/14   Emilia Beck, PA-C  methocarbamol (ROBAXIN) 500 MG tablet Take 1 tablet (500 mg total) by mouth 2 (two) times daily as needed for muscle spasms. Patient not taking: Reported on 09/16/2015 07/01/14   Joycie Peek, PA-C  Phenylephrine-DM-GG-APAP (TYLENOL COLD MULTI-SYMPTOM) 5-10-200-325 MG TABS Take 2 tablets by mouth every 6 (six) hours as needed (for cold symptoms).    [provider]    Family History Family History  Problem Relation Age of Onset  . Hypertension Mother   . Mental retardation Brother        down's syndrome  . Asthma Son     Social History Social History   Tobacco Use  . Smoking status: Current Every Day Smoker    Packs/day: 0.25    Years: 16.00    Pack years: 4.00    Types: Cigarettes    Last attempt to quit: 06/13/2013    Years since quitting: 4.0  . Smokeless tobacco: Never Used  Substance Use Topics  . Alcohol use: No    Alcohol/week: 0.0 oz    Comment: [place].  . Drug use: Yes    Types: Marijuana    Comment: not as often     Allergies   Patient has no known allergies.   Review of Systems Review of Systems  Constitutional: Negative for chills and fever.  HENT: Positive for dental problem. Negative for congestion, ear pain, postnasal drip, sore throat, trouble swallowing and voice change.   Respiratory: Positive for cough. Negative for wheezing and stridor.   Cardiovascular: Negative for chest pain.  Gastrointestinal: Negative for nausea and vomiting.  Neurological: Positive for headaches.     Physical Exam Updated Vital Signs BP 120/72 (BP Location: Right Arm)   Pulse 70   Temp 98.8 F (37.1 C) (Oral)   Resp 18   Ht 5\' 5"  (1.651 m)   Wt 100.9 kg (222 lb 8 oz)   LMP 06/28/2017   SpO2 100%   BMI 37.03 kg/m   Physical Exam    Constitutional: She appears well-developed and well-nourished. No distress.  Sitting comfortably in bed.  HENT:  Head: Normocephalic and atraumatic.  Mouth/Throat:    Dental cavities and poor oral dentition noted. Pain along tooth  as depicted in image. No abscess noted. Midline uvula. No trismus. OP clear and moist. No oropharyngeal erythema or edema. Neck supple with no tenderness. No facial edema.  Eyes: Conjunctivae are normal. Right eye exhibits no discharge. Left eye exhibits no discharge.  EOMs normal to gross examination.  Neck: Normal range of motion.  Cardiovascular: Normal rate, regular rhythm and normal heart sounds.  Pulmonary/Chest: Effort normal and breath sounds normal.  Normal respiratory effort. Patient converses comfortably. No audible wheeze or stridor.  Abdominal: She exhibits no distension.  Musculoskeletal: Normal range of motion.  Neurological: She is alert.  Cranial nerves intact to gross observation. Patient moves extremities without difficulty.  Skin: Skin is warm and dry. She is not diaphoretic.  Psychiatric: She has a normal mood and affect. Her behavior is normal. Judgment and thought content normal.  Nursing note and vitals reviewed.    ED Treatments / Results  Labs (all labs ordered are listed, but only abnormal results are displayed) Labs Reviewed - No data to display  EKG  EKG Interpretation None       Radiology No results found.  Procedures Procedures (including critical care time)  Medications Ordered in ED Medications  albuterol (PROVENTIL HFA;VENTOLIN HFA) 108 (90 Base) MCG/ACT inhaler 2 puff (2 puffs Inhalation Given 06/28/17 1719)  cyclobenzaprine (FLEXERIL) tablet 10 mg (10 mg Oral Given 06/28/17 1719)     Initial Impression / Assessment and Plan / ED Course  I have reviewed the triage vital signs and the nursing notes.  Pertinent labs & imaging results that were available during my care of the patient were reviewed by me  and considered in my medical decision making (see chart for details).     Final Clinical Impressions(s) / ED Diagnoses   Final diagnoses:  Chronic dental pain  Cough   Patient is nontoxic-appearing and in no acute distress.  Patient did not take antipyretics prior to arrival and is afebrile.  Lungs are clear patient is a symptomatically wheeze.  This is likely sequelae of viral bronchitis.  I discussed this with patient.  Given patient's history of asthma that she does not have her inhaler at present, will prescribe inhaler as well as refills.  Patient is actively requesting Tussionex and other cough suppressants with controlled substances in them.  I discussed with patient that these are not indicated at this time and I would recommend other remedies.  I encouraged Flexeril for muscle relaxation in the thorax.  Liquid guaifenesin for expectoration.  Patient declined LawyerTessalon Perles.  Additionally, patient reports that penicillin will make her itch, so I discussed short course of clindamycin for dental pain.  Return precautions given for any worsening cough, persistent fevers, difficulty breathing, or difficulty swallowing related to her toothache.  Patient is in understanding and agrees with the plan of care.  Patient verbally verified a safe ride from the ED. Proceeded with prescribing Flexeril for muscle relaxation in the ED.  ED Discharge Orders        Ordered    albuterol (PROVENTIL HFA;VENTOLIN HFA) 108 (90 Base) MCG/ACT inhaler  Every 2 hours PRN     06/28/17 1719    cyclobenzaprine (FLEXERIL) 10 MG tablet  2 times daily PRN     06/28/17 1719    guaifenesin (ROBITUSSIN) 100 MG/5ML syrup  Every 4 hours PRN     06/28/17 1719    clindamycin (CLEOCIN) 300 MG capsule  3 times daily     06/28/17 1719       Delia ChimesMurray, Carola Viramontes B, PA-C 06/28/17 1759    Margarita Grizzleay, Danielle, MD 06/28/17 2107

## 2017-06-28 NOTE — Discharge Instructions (Signed)
Please read and follow all provided instructions.  Your diagnoses today include:  1. Chronic dental pain   2. Cough     You appear to have an upper respiratory infection (URI). An upper respiratory tract infection, or cold, is a viral infection of the air passages leading to the lungs. It should improve gradually after 5-7 days. You may have a lingering cough that lasts for 2- 4 weeks after the infection.  Tests performed today include: Vital signs. See below for your results today.   Medications prescribed:   Take any prescribed medications only as directed. Treatment for your infection is aimed at treating the symptoms. There are no medications, such as antibiotics, that will cure your infection.   Please take all of your antibiotics until finished.   You may develop abdominal discomfort or nausea from the antibiotic. If this occurs, you may take it with food. Some patients also get diarrhea with antibiotics. You may help offset this with probiotics which you can buy or get in yogurt. Do not eat or take the probiotics until 2 hours after your antibiotic. Some women develop vaginal yeast infections after antibiotics. If you develop unusual vaginal discharge after being on this medication, please see your primary care provider.   Some people develop allergies to antibiotics. Symptoms of antibiotic allergy can be mild and include a flat rash and itching. They can also be more serious and include:  ?Hives - Hives are raised, red patches of skin that are usually very itchy.  ?Lip or tongue swelling  ?Trouble swallowing or breathing  ?Blistering of the skin or mouth.  If you have any of these serious symptoms, please seek emergency medical care immediately.  Home care instructions:  Follow any educational materials contained in this packet.   Your illness is contagious and can be spread to others, especially during the first 3 or 4 days. It cannot be cured by antibiotics or other  medicines. Take basic precautions such as washing your hands often, covering your mouth when you cough or sneeze, and avoiding public places where you could spread your illness to others.   Please continue drinking plenty of fluids.  Use over-the-counter medicines as needed as directed on packaging for symptom relief.  You may also use ibuprofen or tylenol as directed on packaging for pain or fever.  Do not take multiple medicines containing Tylenol or acetaminophen to avoid taking too much of this medication.  Follow-up instructions: Please follow-up with your primary care provider in the next 3 days for further evaluation of your symptoms if you are not feeling better.   Return instructions:  Please return to the Emergency Department if you experience worsening symptoms.  RETURN IMMEDIATELY IF you develop shortness of breath, confusion or altered mental status, a new rash, become dizzy, faint, or poorly responsive, or are unable to be cared for at home. Please return if you have persistent vomiting and cannot keep down fluids or develop a fever that is not controlled by tylenol or motrin.   For tooth pain, please return to the emergency department for any difficulty breathing or difficulty swallowing, or change in your voice quality. Please return if you have any other emergent concerns.  Additional Information:  To find a primary care or specialty doctor please call [phone] or [phone] to access "[facility] Find a Doctor Service."  You may also go on the [facility] website at [email or web address]  There are also multiple [facility] practices throughout [place]  that are frequently accepting new patients. You may find a clinic that is close to your home and contact them.  [facility] [place] [address] [place] [phone]  [facility] in [place] (also locations in [place] and  [place]) - [address] [place] [phone]  [facility] - [address] [place] [phone]   Your vital signs today were: BP 124/67 (BP Location: Right Arm)    Pulse 70    Temp 98.8 F (37.1 C) (Oral)    Resp 16    Ht 5\' 5"  (1.651 m)    Wt 100.9 kg (222 lb 8 oz)    LMP 06/28/2017    SpO2 100%    BMI 37.03 kg/m  If your blood pressure (BP) was elevated above 135/85 this visit, please have this repeated by your doctor within one month. --------------

## 2017-06-28 NOTE — ED Triage Notes (Addendum)
Pt reports ongoing cough and congestion. Hx of asthma, Is out of her inhaler and cough meds. No resp distress is noted at triage. Also has right side dental pain.

## 2017-08-05 DIAGNOSIS — J22 Unspecified acute lower respiratory infection: Secondary | ICD-10-CM | POA: Diagnosis not present

## 2017-09-08 ENCOUNTER — Encounter (HOSPITAL_COMMUNITY): Payer: Self-pay | Admitting: Psychiatry

## 2017-09-08 ENCOUNTER — Ambulatory Visit (INDEPENDENT_AMBULATORY_CARE_PROVIDER_SITE_OTHER): Payer: Medicare Other | Admitting: Psychiatry

## 2017-09-08 VITALS — BP 102/68 | HR 64 | Ht 65.0 in | Wt 210.4 lb

## 2017-09-08 DIAGNOSIS — F17210 Nicotine dependence, cigarettes, uncomplicated: Secondary | ICD-10-CM

## 2017-09-08 DIAGNOSIS — Z62810 Personal history of physical and sexual abuse in childhood: Secondary | ICD-10-CM | POA: Diagnosis not present

## 2017-09-08 DIAGNOSIS — Z8659 Personal history of other mental and behavioral disorders: Secondary | ICD-10-CM

## 2017-09-08 DIAGNOSIS — F4312 Post-traumatic stress disorder, chronic: Secondary | ICD-10-CM | POA: Diagnosis not present

## 2017-09-08 DIAGNOSIS — Z79899 Other long term (current) drug therapy: Secondary | ICD-10-CM | POA: Diagnosis not present

## 2017-09-08 DIAGNOSIS — J4540 Moderate persistent asthma, uncomplicated: Secondary | ICD-10-CM | POA: Diagnosis not present

## 2017-09-08 DIAGNOSIS — Z810 Family history of intellectual disabilities: Secondary | ICD-10-CM

## 2017-09-08 MED ORDER — ALBUTEROL SULFATE HFA 108 (90 BASE) MCG/ACT IN AERS: 2.0000 | INHALATION_SPRAY | RESPIRATORY_TRACT | 0 refills | Status: DC | PRN

## 2017-09-08 MED ORDER — SERTRALINE HCL 50 MG PO TABS: ORAL_TABLET | ORAL | 0 refills | Status: DC
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
## 2017-11-05 ENCOUNTER — Ambulatory Visit (HOSPITAL_COMMUNITY): Payer: Medicare Other | Admitting: Psychiatry

## 2017-11-20 DIAGNOSIS — Z5181 Encounter for therapeutic drug level monitoring: Secondary | ICD-10-CM | POA: Diagnosis not present

## 2017-11-23 DIAGNOSIS — Z5181 Encounter for therapeutic drug level monitoring: Secondary | ICD-10-CM | POA: Diagnosis not present

## 2017-12-09 DIAGNOSIS — Z5181 Encounter for therapeutic drug level monitoring: Secondary | ICD-10-CM | POA: Diagnosis not present

## 2017-12-14 DIAGNOSIS — Z5181 Encounter for therapeutic drug level monitoring: Secondary | ICD-10-CM | POA: Diagnosis not present

## 2018-02-11 DIAGNOSIS — Z5181 Encounter for therapeutic drug level monitoring: Secondary | ICD-10-CM | POA: Diagnosis not present

## 2018-02-17 DIAGNOSIS — N898 Other specified noninflammatory disorders of vagina: Secondary | ICD-10-CM | POA: Diagnosis not present

## 2018-02-17 DIAGNOSIS — R300 Dysuria: Secondary | ICD-10-CM | POA: Diagnosis not present

## 2018-02-17 DIAGNOSIS — M546 Pain in thoracic spine: Secondary | ICD-10-CM | POA: Diagnosis not present

## 2018-02-17 DIAGNOSIS — Z3202 Encounter for pregnancy test, result negative: Secondary | ICD-10-CM | POA: Diagnosis not present

## 2018-02-17 DIAGNOSIS — R1030 Lower abdominal pain, unspecified: Secondary | ICD-10-CM | POA: Diagnosis not present

## 2018-02-17 DIAGNOSIS — N760 Acute vaginitis: Secondary | ICD-10-CM | POA: Diagnosis not present

## 2018-02-17 DIAGNOSIS — B9689 Other specified bacterial agents as the cause of diseases classified elsewhere: Secondary | ICD-10-CM | POA: Diagnosis not present

## 2018-02-22 DIAGNOSIS — Z5181 Encounter for therapeutic drug level monitoring: Secondary | ICD-10-CM | POA: Diagnosis not present

## 2018-03-02 DIAGNOSIS — Z5181 Encounter for therapeutic drug level monitoring: Secondary | ICD-10-CM | POA: Diagnosis not present

## 2018-05-06 ENCOUNTER — Inpatient Hospital Stay (HOSPITAL_COMMUNITY)
Admission: AD | Admit: 2018-05-06 | Discharge: 2018-05-06 | Disposition: A | Discharge status: Home / Self Care | Payer: Medicare Other | Source: Ambulatory Visit | Attending: Obstetrics and Gynecology | Admitting: Obstetrics and Gynecology

## 2018-05-06 DIAGNOSIS — Z202 Contact with and (suspected) exposure to infections with a predominantly sexual mode of transmission: Secondary | ICD-10-CM | POA: Diagnosis not present

## 2018-05-06 DIAGNOSIS — N898 Other specified noninflammatory disorders of vagina: Secondary | ICD-10-CM | POA: Diagnosis present

## 2018-05-06 DIAGNOSIS — Z113 Encounter for screening for infections with a predominantly sexual mode of transmission: Secondary | ICD-10-CM

## 2018-05-06 DIAGNOSIS — F17210 Nicotine dependence, cigarettes, uncomplicated: Secondary | ICD-10-CM | POA: Diagnosis not present

## 2018-05-06 DIAGNOSIS — N760 Acute vaginitis: Secondary | ICD-10-CM | POA: Diagnosis not present

## 2018-05-06 DIAGNOSIS — B9689 Other specified bacterial agents as the cause of diseases classified elsewhere: Secondary | ICD-10-CM | POA: Insufficient documentation

## 2018-05-06 LAB — WET PREP, GENITAL
Sperm: NONE SEEN
Trich, Wet Prep: NONE SEEN
WBC, Wet Prep HPF POC: NONE SEEN
Yeast Wet Prep HPF POC: NONE SEEN

## 2018-05-06 MED ORDER — METRONIDAZOLE 500 MG PO TABS: 500.0000 mg | ORAL_TABLET | Freq: Two times a day (BID) | ORAL | 0 refills | Status: DC

## 2018-05-06 MED ORDER — CEFTRIAXONE SODIUM 250 MG IJ SOLR
250.0000 mg | Freq: Once | INTRAMUSCULAR | Status: AC
  Administered 2018-05-06: 250 mg via INTRAMUSCULAR
  Filled 2018-05-06: qty 250

## 2018-05-06 MED ORDER — AZITHROMYCIN 250 MG PO TABS
1000.0000 mg | ORAL_TABLET | Freq: Once | ORAL | Status: AC
  Administered 2018-05-06: 1000 mg via ORAL
  Filled 2018-05-06: qty 4

## 2018-05-06 NOTE — MAU Provider Note (Signed)
History     CSN: [id number]  Arrival date and time: 05/06/18 1319   First Provider Initiated Contact with Patient 05/06/18 1416      Chief Complaint  Patient presents with  . Vaginal Discharge   Karen Lester is a 32 y.o. G6P5 not currently pregnant who presents to MAU for possible STD exposure and vaginal discharge. She reports vaginal discharge has been present for 1 week. Describes vaginal discharge as clear with an odor, she denies abdominal pain or vaginal bleeding. She reports she was called by a partner of hers that told her he was diagnosed with Gonorrhea and she wants to be testing and treated prior to discharge today.    OB History    Gravida  6   Para  5   Term  5   Preterm      AB  1   Living  5     SAB      TAB  1   Ectopic      Multiple      Live Births  5           Past Medical History:  Diagnosis Date  . Abnormal Pap smear    had colpo  . ADHD (attention deficit hyperactivity disorder)   . Anxiety   . Asthma   . Bipolar 1 disorder (HCC)   . Depression    doing fine, not currently on meds  . Herpes   . Urinary tract infection   . Vaginal Pap smear, abnormal     Past Surgical History:  Procedure Laterality Date  . COLPOSCOPY    . INDUCED ABORTION    . TUBAL LIGATION Bilateral 08/04/2013   Procedure: POST PARTUM TUBAL LIGATION;  Surgeon: Lesly Dukes, MD;  Location: [facility];  Service: Gynecology;  Laterality: Bilateral;    Family History  Problem Relation Age of Onset  . Hypertension Mother   . Mental retardation Brother        down's syndrome  . Asthma Son     Social History   Tobacco Use  . Smoking status: Current Every Day Smoker    Packs/day: 0.25    Years: 16.00    Pack years: 4.00    Types: Cigarettes    Last attempt to quit: 06/13/2013    Years since quitting: 4.8  . Smokeless tobacco: Never Used  Substance Use Topics  . Alcohol use: No    Comment: OCcas.  . Drug use: No    Comment: not as often     Allergies: No Known Allergies  Medications Prior to Admission  Medication Sig Dispense Refill Last Dose  . albuterol (PROVENTIL HFA;VENTOLIN HFA) 108 (90 Base) MCG/ACT inhaler Inhale 2 puffs into the lungs every 2 (two) hours as needed for wheezing or shortness of breath (cough). 1 Inhaler 0   . guaifenesin (ROBITUSSIN) 100 MG/5ML syrup Take 10 mLs (200 mg total) by mouth every 4 (four) hours as needed for cough. 60 mL 0 Taking  . ketorolac (TORADOL) 10 MG tablet Take 1 tablet (10 mg total) by mouth every 6 (six) hours as needed. 20 tablet 0 Taking  . sertraline (ZOLOFT) 50 MG tablet Take 0.5 tablets (25 mg total) by mouth daily for 7 days, THEN 1 tablet (50 mg total) daily. 90 tablet 0     Review of Systems  Constitutional: Negative.   Respiratory: Negative.   Cardiovascular: Negative.   Gastrointestinal: Negative.   Genitourinary: Positive for vaginal discharge. Negative for  difficulty urinating, dysuria, frequency, hematuria and urgency.   Physical Exam   Blood pressure (!) 144/68, pulse 68, temperature 98.7 F (37.1 C), temperature source Oral, resp. rate 15, height 5\' 5"  (1.651 m), weight 82.6 kg, last menstrual period 05/01/2018, SpO2 100 %, unknown if currently breastfeeding.  Physical Exam  Nursing note and vitals reviewed. Constitutional: She appears well-developed and well-nourished. No distress.  Cardiovascular: Normal rate, regular rhythm and normal heart sounds.  Respiratory: Effort normal and breath sounds normal. No respiratory distress. She has no wheezes.  Genitourinary:  Genitourinary Comments: Vaginal swabs collected by patient    MAU Course  Procedures  MDM Orders Placed This Encounter  Procedures  . Wet prep, genital  . Nursing Communication   Meds ordered this encounter  Medications  . AND Linked Order Group   . cefTRIAXone (ROCEPHIN) injection 250 mg     Order Specific Question:   Antibiotic Indication:     Answer:   STD   . azithromycin  (ZITHROMAX) tablet 1,000 mg   Results for orders placed or performed during the hospital encounter of 05/06/18 (from the past 24 hour(s))  Wet prep, genital     Status: Abnormal   Collection Time: 05/06/18  2:18 PM  Result Value Ref Range   Yeast Wet Prep HPF POC NONE SEEN NONE SEEN   Trich, Wet Prep NONE SEEN NONE SEEN   Clue Cells Wet Prep HPF POC PRESENT (A) NONE SEEN   WBC, Wet Prep HPF POC NONE SEEN NONE SEEN   Sperm NONE SEEN    Wet prep positive for clue cells, will treat for BV in addition to prophylactic treatment for Gonorrhea. Discussed need to abstain from alcohol during Flagyl and abstain from intercourse for 7-10 days from treatment today. Discussed reason to return to MAU. Discussed need for follow up at HD for additional STD screening or treatment. Pt stable at time of discharge.   Assessment and Plan   1. BV (bacterial vaginosis)   2. Possible exposure to STD   3. Screening for STDs (sexually transmitted diseases)    Discharge home  Discussed reasons to return to MAU  Return to HD or PCP for worsening symptoms or additional testing Rx for Flagyl    Allergies as of 05/06/2018   No Known Allergies     Medication List    STOP taking these medications   ketorolac 10 MG tablet Commonly known as:  TORADOL     TAKE these medications   albuterol 108 (90 Base) MCG/ACT inhaler Commonly known as:  PROVENTIL HFA;VENTOLIN HFA Inhale 2 puffs into the lungs every 2 (two) hours as needed for wheezing or shortness of breath (cough).   guaifenesin 100 MG/5ML syrup Commonly known as:  ROBITUSSIN Take 10 mLs (200 mg total) by mouth every 4 (four) hours as needed for cough.   metroNIDAZOLE 500 MG tablet Commonly known as:  FLAGYL Take 1 tablet (500 mg total) by mouth 2 (two) times daily.   sertraline 50 MG tablet Commonly known as:  ZOLOFT Take 0.5 tablets (25 mg total) by mouth daily for 7 days, THEN 1 tablet (50 mg total) daily. Start taking on:  09/08/2017        Sharyon Cable CNM 05/06/2018, 4:49 PM

## 2018-05-06 NOTE — MAU Note (Signed)
Pt was to be reassessed after @ 3:55p for any allergic reaction s/p Azithromycin and Rocephin administration. I was informed by Leonides Schanz RN that pt came to the front desk and states,"I am fine" and left.  Adah Perl RN

## 2018-05-06 NOTE — Discharge Instructions (Signed)
In late 2019, the [facility] will be moving to the [place] campus. At that time, the MAU (Maternity Admissions Unit), where you are being seen today, will no longer take care of non-pregnant patients. We strongly encourage you to find a doctor's office before that time, so that you can be seen with any GYN concerns, like vaginal discharge, urinary tract infection, etc.. in a timely manner. ° °In order to make an office visit more convenient, the [facility] will be offering evening hours with same-day appointments, walk-in appointments and scheduled appointments available during this time. ° °[facility] Hours: °Monday - 8am - 7:30 pm with walk-in between 4pm- 7:30 pm °Tuesday - 8 am - 5 pm (starting 10/13/17 we will be open late and accepting walk-ins from 4pm - 7:30pm) °Wednesday - 8 am - 5 pm (starting 01/13/18 we will be open late and accepting walk-ins from 4pm - 7:30pm) °Thursday 8 am - 5 pm (starting 04/15/18 we will be open late and accepting walk-ins from 4pm - 7:30pm) °Friday 8 am - 5 pm ° °For an appointment please call the [facility] at [phone] ° °For urgent needs,  Urgent Care is also available for management of urgent GYN complaints such as vaginal discharge or urinary tract infections. ° ° ° ° ° °

## 2018-05-06 NOTE — MAU Note (Signed)
Not in lobby

## 2018-05-06 NOTE — MAU Note (Signed)
Pt states she has had discharge for the last week, discharge is clear but has an odor. Denies abd pain.

## 2018-05-07 LAB — GC/CHLAMYDIA PROBE AMP (~~LOC~~) NOT AT ARMC
Chlamydia: NEGATIVE
Neisseria Gonorrhea: NEGATIVE

## 2018-07-29 ENCOUNTER — Ambulatory Visit (HOSPITAL_COMMUNITY)
Admission: EM | Admit: 2018-07-29 | Discharge: 2018-07-29 | Disposition: A | Discharge status: Home / Self Care | Payer: Medicare Other | Attending: Family Medicine | Admitting: Family Medicine

## 2018-07-29 ENCOUNTER — Encounter (HOSPITAL_COMMUNITY): Payer: Self-pay | Admitting: Emergency Medicine

## 2018-07-29 DIAGNOSIS — Z202 Contact with and (suspected) exposure to infections with a predominantly sexual mode of transmission: Secondary | ICD-10-CM

## 2018-07-29 DIAGNOSIS — N898 Other specified noninflammatory disorders of vagina: Secondary | ICD-10-CM | POA: Diagnosis not present

## 2018-07-29 DIAGNOSIS — Z113 Encounter for screening for infections with a predominantly sexual mode of transmission: Secondary | ICD-10-CM | POA: Diagnosis not present

## 2018-07-29 LAB — POCT URINALYSIS DIP (DEVICE)
Bilirubin Urine: NEGATIVE
GLUCOSE, UA: NEGATIVE mg/dL
Hgb urine dipstick: NEGATIVE
KETONES UR: NEGATIVE mg/dL
LEUKOCYTES UA: NEGATIVE
NITRITE: NEGATIVE
PH: 6.5 (ref 5.0–8.0)
PROTEIN: NEGATIVE mg/dL
Specific Gravity, Urine: 1.02 (ref 1.005–1.030)
Urobilinogen, UA: 0.2 mg/dL (ref 0.0–1.0)

## 2018-07-29 MED ORDER — AZITHROMYCIN 250 MG PO TABS
1000.0000 mg | ORAL_TABLET | Freq: Once | ORAL | Status: AC
  Administered 2018-07-29: 1000 mg via ORAL

## 2018-07-29 MED ORDER — CEFTRIAXONE SODIUM 250 MG IJ SOLR
INTRAMUSCULAR | Status: AC
  Filled 2018-07-29: qty 250

## 2018-07-29 MED ORDER — AZITHROMYCIN 250 MG PO TABS
ORAL_TABLET | ORAL | Status: AC
  Filled 2018-07-29: qty 4

## 2018-07-29 MED ORDER — METRONIDAZOLE 500 MG PO TABS: 500.0000 mg | ORAL_TABLET | Freq: Two times a day (BID) | ORAL | 0 refills | Status: DC

## 2018-07-29 MED ORDER — CEFTRIAXONE SODIUM 250 MG IJ SOLR
250.0000 mg | Freq: Once | INTRAMUSCULAR | Status: AC
  Administered 2018-07-29: 250 mg via INTRAMUSCULAR

## 2018-07-29 MED ORDER — FLUCONAZOLE 150 MG PO TABS: 150.0000 mg | ORAL_TABLET | Freq: Every day | ORAL | 0 refills | Status: DC

## 2018-07-29 NOTE — ED Triage Notes (Signed)
Pt presents to [facility] for yellow-ish brownish vaginal discharge, malodorous, boyfriend tested positive for gonorrhea and chlamydia.  Also concerned for trich.

## 2018-07-29 NOTE — ED Provider Notes (Signed)
[facility]    CSN: [id number] Arrival date & time: 07/29/18  1418     History   Chief Complaint Chief Complaint  Patient presents with  . Vaginal Discharge    HPI Karen Lester is a 33 y.o. female.   HPI  Patient states that she has a single regular boyfriend.  She states that she "usually" uses condoms for protection.  She states that she told her boyfriend that she did not "feel right".  Was having vaginal discharge with odor.  He went to get tested and notified her that he is positive for chlamydia, gonorrhea, and trichomonas.  She states that a month ago she was tested for HIV so does not feel the need for blood testing.  She also has an odor and does have a history of BV.  She states she wants to be tested and treated for "everything".  She does not have any rash or external lesions.  No herpes testing is indicated. No fever chills.  No abdominal cramping or pain.  No irregular vaginal bleeding.  No nausea or vomiting.  No urinary symptoms.  Past Medical History:  Diagnosis Date  . Abnormal Pap smear    had colpo  . ADHD (attention deficit hyperactivity disorder)   . Anxiety   . Asthma   . Bipolar 1 disorder (HCC)   . Depression    doing fine, not currently on meds  . Herpes   . Urinary tract infection   . Vaginal Pap smear, abnormal     Patient Active Problem List   Diagnosis Date Noted  . Pregnancy 08/03/2013  . Bipolar 1 disorder (HCC) 02/24/2012    Class: Chronic  . ADHD (attention deficit hyperactivity disorder) 02/24/2012    Class: Chronic  . Polysubstance abuse (HCC) 02/24/2012    Class: Chronic    Past Surgical History:  Procedure Laterality Date  . COLPOSCOPY    . INDUCED ABORTION    . TUBAL LIGATION Bilateral 08/04/2013   Procedure: POST PARTUM TUBAL LIGATION;  Surgeon: Lesly Dukes, MD;  Location: [facility];  Service: Gynecology;  Laterality: Bilateral;    OB History    Gravida  6   Para  5   Term  5   Preterm      AB  1    Living  5     SAB      TAB  1   Ectopic      Multiple      Live Births  5            Home Medications    Prior to Admission medications   Medication Sig Start Date End Date Taking? Authorizing Provider  albuterol (PROVENTIL HFA;VENTOLIN HFA) 108 (90 Base) MCG/ACT inhaler Inhale 2 puffs into the lungs every 2 (two) hours as needed for wheezing or shortness of breath (cough). 09/08/17  Yes Eksir, Bo Mcclintock, MD  fluconazole (DIFLUCAN) 150 MG tablet Take 1 tablet (150 mg total) by mouth daily. Repeat in 1 week if needed 07/29/18   Eustace Moore, MD  metroNIDAZOLE (FLAGYL) 500 MG tablet Take 1 tablet (500 mg total) by mouth 2 (two) times daily. 07/29/18   Eustace Moore, MD  sertraline (ZOLOFT) 50 MG tablet Take 0.5 tablets (25 mg total) by mouth daily for 7 days, THEN 1 tablet (50 mg total) daily. 09/08/17 12/14/17  Burnard Leigh, MD    Family History Family History  Problem Relation Age of Onset  .  Hypertension Mother   . Mental retardation Brother        down's syndrome  . Asthma Son     Social History Social History   Tobacco Use  . Smoking status: Current Every Day Smoker    Packs/day: 0.25    Years: 16.00    Pack years: 4.00    Types: Cigarettes    Last attempt to quit: 06/13/2013    Years since quitting: 5.1  . Smokeless tobacco: Never Used  Substance Use Topics  . Alcohol use: No    Comment: OCcas.  . Drug use: No    Comment: not as often     Allergies   Patient has no known allergies.   Review of Systems Review of Systems  Constitutional: Negative for chills and fever.  HENT: Negative for ear pain and sore throat.   Eyes: Negative for pain and visual disturbance.  Respiratory: Negative for cough and shortness of breath.   Cardiovascular: Negative for chest pain and palpitations.  Gastrointestinal: Negative for abdominal pain and vomiting.  Genitourinary: Positive for vaginal discharge. Negative for dysuria and hematuria.    Musculoskeletal: Negative for arthralgias and back pain.  Skin: Negative for color change and rash.  Neurological: Negative for seizures and syncope.  All other systems reviewed and are negative.    Physical Exam Triage Vital Signs ED Triage Vitals  Enc Vitals Group     BP 07/29/18 1508 128/90     Pulse Rate 07/29/18 1508 84     Resp 07/29/18 1508 16     Temp 07/29/18 1508 98.2 F (36.8 C)     Temp Source 07/29/18 1508 Oral     SpO2 07/29/18 1508 100 %     Weight --      Height --      Head Circumference --      Peak Flow --      Pain Score 07/29/18 1524 0     Pain Loc --      Pain Edu? --      Excl. in GC? --    No data found.  Updated Vital Signs BP 116/66 (BP Location: Left Arm)   Pulse 74   Temp 98.7 F (37.1 C) (Oral)   Resp 16   LMP 07/25/2018   SpO2 99%   Visual Acuity Right Eye Distance:   Left Eye Distance:   Bilateral Distance:    Right Eye Near:   Left Eye Near:    Bilateral Near:     Physical Exam Constitutional:      General: She is not in acute distress.    Appearance: She is well-developed.  HENT:     Head: Normocephalic and atraumatic.  Eyes:     Conjunctiva/sclera: Conjunctivae normal.     Pupils: Pupils are equal, round, and reactive to light.  Neck:     Musculoskeletal: Normal range of motion.  Cardiovascular:     Rate and Rhythm: Normal rate and regular rhythm.  Pulmonary:     Effort: Pulmonary effort is normal. No respiratory distress.     Breath sounds: Normal breath sounds.  Abdominal:     General: There is no distension.     Palpations: Abdomen is soft.     Tenderness: There is no abdominal tenderness. There is no right CVA tenderness or left CVA tenderness.  Musculoskeletal: Normal range of motion.  Skin:    General: Skin is warm and dry.  Neurological:     Mental Status: She is  alert.  Psychiatric:        Mood and Affect: Mood normal.        Thought Content: Thought content normal.      UC Treatments / Results   Labs (all labs ordered are listed, but only abnormal results are displayed) Labs Reviewed  POCT URINALYSIS DIP (DEVICE)  CERVICOVAGINAL ANCILLARY ONLY    EKG None  Radiology No results found.  Procedures Procedures (including critical care time)  Medications Ordered in UC Medications  cefTRIAXone (ROCEPHIN) injection 250 mg (has no administration in time range)  azithromycin (ZITHROMAX) tablet 1,000 mg (has no administration in time range)    Initial Impression / Assessment and Plan / UC Course  I have reviewed the triage vital signs and the nursing notes.  Pertinent labs & imaging results that were available during my care of the patient were reviewed by me and considered in my medical decision making (see chart for details).     Reviewed safe sex.  Use of condoms each and every time.  Reviewed prevention of BV.  BV instructions are given. Final Clinical Impressions(s) / UC Diagnoses   Final diagnoses:  Exposure to STD  Vaginal discharge     Discharge Instructions     We are testing you and treating you for STD exposure We did lab testing during this visit.  If there are any abnormal findings that require change in medicine or indicate a positive result, you will be notified.  If all of your tests are normal, you will not be called.   The metronidazole for a week treats both BV and trichomonas The Diflucan will treat or prevent a yeast infection Make sure to practice safe sex to prevent infection     ED Prescriptions    Medication Sig Dispense Auth. Provider   metroNIDAZOLE (FLAGYL) 500 MG tablet Take 1 tablet (500 mg total) by mouth 2 (two) times daily. 14 tablet Eustace MooreNelson, Yvonne Sue, MD   fluconazole (DIFLUCAN) 150 MG tablet Take 1 tablet (150 mg total) by mouth daily. Repeat in 1 week if needed 2 tablet Eustace MooreNelson, Yvonne Sue, MD     Controlled Substance Prescriptions [place] Controlled Substance Registry consulted? Not Applicable   Eustace MooreNelson, Yvonne Sue,  MD 07/29/18 1546

## 2018-07-29 NOTE — Discharge Instructions (Addendum)
We are testing you and treating you for STD exposure We did lab testing during this visit.  If there are any abnormal findings that require change in medicine or indicate a positive result, you will be notified.  If all of your tests are normal, you will not be called.   The metronidazole for a week treats both BV and trichomonas The Diflucan will treat or prevent a yeast infection Make sure to practice safe sex to prevent infection

## 2018-07-30 LAB — CERVICOVAGINAL ANCILLARY ONLY
Chlamydia: NEGATIVE
NEISSERIA GONORRHEA: NEGATIVE
TRICH (WINDOWPATH): NEGATIVE

## 2019-04-13 ENCOUNTER — Encounter (HOSPITAL_COMMUNITY): Payer: Self-pay | Admitting: Emergency Medicine

## 2019-04-13 ENCOUNTER — Ambulatory Visit (HOSPITAL_COMMUNITY)
Admission: EM | Admit: 2019-04-13 | Discharge: 2019-04-13 | Discharge status: Against Medical Advice | Payer: Medicare Other | Attending: Family Medicine | Admitting: Family Medicine

## 2019-04-13 ENCOUNTER — Ambulatory Visit (HOSPITAL_COMMUNITY)
Admission: EM | Admit: 2019-04-13 | Discharge: 2019-04-13 | Disposition: A | Discharge status: Home / Self Care | Payer: Medicare Other | Attending: Family Medicine | Admitting: Family Medicine

## 2019-04-13 ENCOUNTER — Other Ambulatory Visit: Payer: Self-pay

## 2019-04-13 DIAGNOSIS — Z3201 Encounter for pregnancy test, result positive: Secondary | ICD-10-CM | POA: Diagnosis not present

## 2019-04-13 LAB — POCT PREGNANCY, URINE: Preg Test, Ur: POSITIVE — AB

## 2019-04-13 MED ORDER — PRENATAL COMPLETE 14-0.4 MG PO TABS: 1.0000 | ORAL_TABLET | Freq: Every day | ORAL | 0 refills | Status: DC

## 2019-04-13 NOTE — ED Provider Notes (Signed)
[facility]    CSN: [id number] Arrival date & time: 04/13/19  1125      History   Chief Complaint Chief Complaint  Patient presents with  . Possible Pregnancy    HPI RAEJEAN SWINFORD is a 33 y.o. female.   Ricke Hey presents with requests for pregnancy testing for confirmation. She had a positive test at home two days ago.she states she has missed her period, yet states her LMP was 03/19/19. She states SHE HAS NOT HAD A TUBAL LIGATION, despite documentation of this in 2015. She is not on birth control. She has had 5 pregnancies and 5 living children. No pain. No bleeding. History  Of adhd, anxiety, asthma, bipolar, depression, herpes.     ROS per HPI, negative if not otherwise mentioned.      Past Medical History:  Diagnosis Date  . Abnormal Pap smear    had colpo  . ADHD (attention deficit hyperactivity disorder)   . Anxiety   . Asthma   . Bipolar 1 disorder (HCC)   . Depression    doing fine, not currently on meds  . Herpes   . Urinary tract infection   . Vaginal Pap smear, abnormal     Patient Active Problem List   Diagnosis Date Noted  . Pregnancy 08/03/2013  . Bipolar 1 disorder (HCC) 02/24/2012    Class: Chronic  . ADHD (attention deficit hyperactivity disorder) 02/24/2012    Class: Chronic  . Polysubstance abuse (HCC) 02/24/2012    Class: Chronic    Past Surgical History:  Procedure Laterality Date  . COLPOSCOPY    . INDUCED ABORTION    . TUBAL LIGATION Bilateral 08/04/2013   Procedure: POST PARTUM TUBAL LIGATION;  Surgeon: Lesly Dukes, MD;  Location: [facility];  Service: Gynecology;  Laterality: Bilateral;    OB History    Gravida  6   Para  5   Term  5   Preterm      AB  1   Living  5     SAB      TAB  1   Ectopic      Multiple      Live Births  5            Home Medications    Prior to Admission medications   Medication Sig Start Date End Date Taking? Authorizing Provider  methylphenidate  (CONCERTA) 36 MG PO CR tablet Take 36 mg by mouth daily.   Yes [provider]  albuterol (PROVENTIL HFA;VENTOLIN HFA) 108 (90 Base) MCG/ACT inhaler Inhale 2 puffs into the lungs every 2 (two) hours as needed for wheezing or shortness of breath (cough). 09/08/17   Burnard Leigh, MD  fluconazole (DIFLUCAN) 150 MG tablet Take 1 tablet (150 mg total) by mouth daily. Repeat in 1 week if needed 07/29/18   Eustace Moore, MD  metroNIDAZOLE (FLAGYL) 500 MG tablet Take 1 tablet (500 mg total) by mouth 2 (two) times daily. 07/29/18   Eustace Moore, MD  Prenatal Vit-Fe Fumarate-FA (PRENATAL COMPLETE) 14-0.4 MG TABS Take 1 tablet by mouth daily. 04/13/19   Georgetta Haber, NP  sertraline (ZOLOFT) 50 MG tablet Take 0.5 tablets (25 mg total) by mouth daily for 7 days, THEN 1 tablet (50 mg total) daily. 09/08/17 12/14/17  Burnard Leigh, MD    Family History Family History  Problem Relation Age of Onset  . Hypertension Mother   . Mental retardation Brother  down's syndrome  . Asthma Son     Social History Social History   Tobacco Use  . Smoking status: Current Every Day Smoker    Packs/day: 0.25    Years: 16.00    Pack years: 4.00    Types: Cigarettes    Last attempt to quit: 06/13/2013    Years since quitting: 5.8  . Smokeless tobacco: Never Used  Substance Use Topics  . Alcohol use: No    Comment: OCcas.  . Drug use: No    Comment: not as often     Allergies   Patient has no known allergies.   Review of Systems Review of Systems   Physical Exam Triage Vital Signs ED Triage Vitals  Enc Vitals Group     BP 04/13/19 1213 119/66     Pulse Rate 04/13/19 1213 73     Resp 04/13/19 1213 16     Temp 04/13/19 1213 98.4 F (36.9 C)     Temp Source 04/13/19 1213 Oral     SpO2 04/13/19 1213 100 %     Weight --      Height --      Head Circumference --      Peak Flow --      Pain Score 04/13/19 1214 0     Pain Loc --      Pain Edu? --      Excl. in  GC? --    No data found.  Updated Vital Signs BP 119/66 (BP Location: Right Arm)   Pulse 73   Temp 98.4 F (36.9 C) (Oral)   Resp 16   LMP  (LMP Unknown)   SpO2 100%    Physical Exam Constitutional:      General: She is not in acute distress.    Appearance: She is well-developed.  Cardiovascular:     Rate and Rhythm: Normal rate.  Pulmonary:     Effort: Pulmonary effort is normal.  Skin:    General: Skin is warm and dry.  Neurological:     Mental Status: She is alert and oriented to person, place, and time.      UC Treatments / Results  Labs (all labs ordered are listed, but only abnormal results are displayed) Labs Reviewed  POCT PREGNANCY, URINE - Abnormal; Notable for the following components:      Result Value   Preg Test, Ur POSITIVE (*)    All other components within normal limits    EKG   Radiology No results found.  Procedures Procedures (including critical care time)  Medications Ordered in UC Medications - No data to display  Initial Impression / Assessment and Plan / UC Course  I have reviewed the triage vital signs and the nursing notes.  Pertinent labs & imaging results that were available during my care of the patient were reviewed by me and considered in my medical decision making (see chart for details).     Exam was interrupted multiple times with patient answering her phone. She had limited interest in further discussed or recommendations about prenatal care, she primarily is requesting confirmation of this pregnancy to present to her partner. Encouraged follow up with OB for initial care. Ambulatory out of clinic without difficulty.   Final Clinical Impressions(s) / UC Diagnoses   Final diagnoses:  Positive pregnancy test     Discharge Instructions     Please call your OB today to notify of your positive test.  [facility] or vaginal bleeding.  Please start a daily prenatal vitamin.     ED Prescriptions    Medication Sig Dispense Auth. Provider   Prenatal Vit-Fe Fumarate-FA (PRENATAL COMPLETE) 14-0.4 MG TABS Take 1 tablet by mouth daily. 60 tablet Zigmund Gottron, NP     PDMP not reviewed this encounter.   Zigmund Gottron, NP 04/13/19 1429

## 2019-04-13 NOTE — ED Notes (Signed)
No answer, called on phone, says she will walk back in building

## 2019-04-13 NOTE — Discharge Instructions (Signed)
Please call your OB today to notify of your positive test.  [facility] or vaginal bleeding.  Please start a daily prenatal vitamin.

## 2019-04-13 NOTE — ED Triage Notes (Signed)
Pt here for pregnancy test.  She is a poor historian on LMP.  Pt states she took a pregnancy test at home last week that was positive.

## 2019-07-05 ENCOUNTER — Encounter (HOSPITAL_COMMUNITY): Payer: Self-pay

## 2019-07-05 ENCOUNTER — Other Ambulatory Visit: Payer: Self-pay

## 2019-07-05 ENCOUNTER — Ambulatory Visit (HOSPITAL_COMMUNITY)
Admission: EM | Admit: 2019-07-05 | Discharge: 2019-07-05 | Disposition: A | Discharge status: Home / Self Care | Payer: Medicare Other | Attending: Family Medicine | Admitting: Family Medicine

## 2019-07-05 DIAGNOSIS — Z8619 Personal history of other infectious and parasitic diseases: Secondary | ICD-10-CM

## 2019-07-05 DIAGNOSIS — L700 Acne vulgaris: Secondary | ICD-10-CM | POA: Insufficient documentation

## 2019-07-05 DIAGNOSIS — Z202 Contact with and (suspected) exposure to infections with a predominantly sexual mode of transmission: Secondary | ICD-10-CM | POA: Insufficient documentation

## 2019-07-05 DIAGNOSIS — Z7251 High risk heterosexual behavior: Secondary | ICD-10-CM | POA: Diagnosis not present

## 2019-07-05 DIAGNOSIS — J4520 Mild intermittent asthma, uncomplicated: Secondary | ICD-10-CM | POA: Insufficient documentation

## 2019-07-05 LAB — POCT URINALYSIS DIP (DEVICE)
Bilirubin Urine: NEGATIVE
Glucose, UA: NEGATIVE mg/dL
Hgb urine dipstick: NEGATIVE
Ketones, ur: NEGATIVE mg/dL
Leukocytes,Ua: NEGATIVE
Nitrite: NEGATIVE
Protein, ur: NEGATIVE mg/dL
Specific Gravity, Urine: >=1.03 (ref 1.005–1.030)
Urobilinogen, UA: 0.2 mg/dL (ref 0.0–1.0)
pH: 6 (ref 5.0–8.0)

## 2019-07-05 MED ORDER — ALBUTEROL SULFATE HFA 108 (90 BASE) MCG/ACT IN AERS: 2.0000 | INHALATION_SPRAY | RESPIRATORY_TRACT | 0 refills | Status: DC | PRN

## 2019-07-05 MED ORDER — CEFTRIAXONE SODIUM 250 MG IJ SOLR
250.0000 mg | Freq: Once | INTRAMUSCULAR | Status: AC
  Administered 2019-07-05: 19:00:00 250 mg via INTRAMUSCULAR

## 2019-07-05 MED ORDER — FLUCONAZOLE 150 MG PO TABS: 150.0000 mg | ORAL_TABLET | Freq: Every day | ORAL | 0 refills | Status: DC

## 2019-07-05 MED ORDER — ALBUTEROL SULFATE (2.5 MG/3ML) 0.083% IN NEBU: 2.5000 mg | INHALATION_SOLUTION | Freq: Four times a day (QID) | RESPIRATORY_TRACT | 12 refills | Status: DC | PRN

## 2019-07-05 MED ORDER — VALACYCLOVIR HCL 500 MG PO TABS: 500.0000 mg | ORAL_TABLET | Freq: Two times a day (BID) | ORAL | 0 refills | Status: AC

## 2019-07-05 MED ORDER — AZITHROMYCIN 250 MG PO TABS
ORAL_TABLET | ORAL | Status: AC
  Filled 2019-07-05: qty 4

## 2019-07-05 MED ORDER — CLINDAMYCIN PHOS-BENZOYL PEROX 1-5 % EX GEL: Freq: Two times a day (BID) | CUTANEOUS | 0 refills | Status: DC

## 2019-07-05 MED ORDER — AZITHROMYCIN 250 MG PO TABS
1000.0000 mg | ORAL_TABLET | Freq: Once | ORAL | Status: AC
  Administered 2019-07-05: 1000 mg via ORAL

## 2019-07-05 MED ORDER — METRONIDAZOLE 500 MG PO TABS: 500.0000 mg | ORAL_TABLET | Freq: Two times a day (BID) | ORAL | 0 refills | Status: DC

## 2019-07-05 MED ORDER — CEFTRIAXONE SODIUM 250 MG IJ SOLR
INTRAMUSCULAR | Status: AC
  Filled 2019-07-05: qty 250

## 2019-07-05 NOTE — ED Triage Notes (Signed)
Pt presents with vaginal discharge and lower pelvic pain that radiates around to her back X 2 weeks.

## 2019-07-05 NOTE — Discharge Instructions (Addendum)
I have given you refills of: 1 albuterol inhaler 2 albuterol solution 3 Valtrex  I have treated you for gonorrhea and chlamydia with a shot and pills I have treated you for BV and yeast with metronidazole and Diflucan  You need to follow-up with a primary care doctor We are testing your urine for infection and we will call you if it is positive

## 2019-07-05 NOTE — ED Provider Notes (Signed)
[facility]    CSN: [id number] Arrival date & time: 07/05/19  1610      History   Chief Complaint Chief Complaint  Patient presents with  . Vaginal Discharge  . Pelvic Pain    HPI Karen Lester is a 33 y.o. female.   HPI  Patient is here for multiple problems. She states that she was recently incarcerated.  Her children are living with her mother She states that she is having sexual relations with 2 different men.  She is concerned about STD She has a history of recurring herpes and is requesting a refill of her acyclovir She has her history of asthma and is requesting a refill of her albuterol inhaler and nebulizer Patient states she also has acne and she is tired of having "lumps" on her cheeks.  She thinks it may be from the mask. Patient states she has lower abdominal pain.  Vaginal discharge.  Thinks it is from an STD.  Thinks it may be urinary tract.  Would like treatment for "everything". No fever or chills.  No exposure to coronavirus.  Past Medical History:  Diagnosis Date  . Abnormal Pap smear    had colpo  . ADHD (attention deficit hyperactivity disorder)   . Anxiety   . Asthma   . Bipolar 1 disorder (HCC)   . Depression    doing fine, not currently on meds  . Herpes   . Urinary tract infection   . Vaginal Pap smear, abnormal     Patient Active Problem List   Diagnosis Date Noted  . Pregnancy 08/03/2013  . Bipolar 1 disorder (HCC) 02/24/2012    Class: Chronic  . ADHD (attention deficit hyperactivity disorder) 02/24/2012    Class: Chronic  . Polysubstance abuse (HCC) 02/24/2012    Class: Chronic    Past Surgical History:  Procedure Laterality Date  . COLPOSCOPY    . INDUCED ABORTION    . TUBAL LIGATION Bilateral 08/04/2013   Procedure: POST PARTUM TUBAL LIGATION;  Surgeon: Lesly DukesKelly H Leggett, MD;  Location: [facility];  Service: Gynecology;  Laterality: Bilateral;    OB History    Gravida  6   Para  5   Term  5   Preterm      AB  1   Living  5     SAB      TAB  1   Ectopic      Multiple      Live Births  5            Home Medications    Prior to Admission medications   Medication Sig Start Date End Date Taking? Authorizing Provider  albuterol (PROVENTIL) (2.5 MG/3ML) 0.083% nebulizer solution Take 3 mLs (2.5 mg total) by nebulization every 6 (six) hours as needed for wheezing or shortness of breath. 07/05/19   Eustace MooreNelson, Avril Busser Sue, MD  albuterol (VENTOLIN HFA) 108 (90 Base) MCG/ACT inhaler Inhale 2 puffs into the lungs every 2 (two) hours as needed for wheezing or shortness of breath (cough). 07/05/19   Eustace MooreNelson, Tramya Schoenfelder Sue, MD  clindamycin-benzoyl peroxide [facility](BENZACLIN) gel Apply topically 2 (two) times daily. 07/05/19   Eustace MooreNelson, Charlene Cowdrey Sue, MD  fluconazole (DIFLUCAN) 150 MG tablet Take 1 tablet (150 mg total) by mouth daily. Repeat in 1 week if needed 07/05/19   Eustace MooreNelson, Blanch Stang Sue, MD  methylphenidate (CONCERTA) 36 MG PO CR tablet Take 36 mg by mouth daily.    [provider]  metroNIDAZOLE (FLAGYL) 500  MG tablet Take 1 tablet (500 mg total) by mouth 2 (two) times daily. 07/05/19   Eustace Moore, MD  Prenatal Vit-Fe Fumarate-FA (PRENATAL COMPLETE) 14-0.4 MG TABS Take 1 tablet by mouth daily. 04/13/19   Georgetta Haber, NP  sertraline (ZOLOFT) 50 MG tablet Take 0.5 tablets (25 mg total) by mouth daily for 7 days, THEN 1 tablet (50 mg total) daily. 09/08/17 12/14/17  Burnard Leigh, MD  valACYclovir (VALTREX) 500 MG tablet Take 1 tablet (500 mg total) by mouth 2 (two) times daily for 5 days. 07/05/19 07/10/19  Eustace Moore, MD    Family History Family History  Problem Relation Age of Onset  . Hypertension Mother   . Mental retardation Brother        down's syndrome  . Asthma Son     Social History Social History   Tobacco Use  . Smoking status: Current Every Day Smoker    Packs/day: 0.25    Years: 16.00    Pack years: 4.00    Types: Cigarettes    Last attempt to quit:  06/13/2013    Years since quitting: 6.0  . Smokeless tobacco: Never Used  Substance Use Topics  . Alcohol use: No    Comment: OCcas.  . Drug use: No    Comment: not as often     Allergies   Patient has no known allergies.   Review of Systems Review of Systems  Constitutional: Negative for chills and fever.  HENT: Negative for congestion and hearing loss.   Eyes: Negative for pain.  Respiratory: Negative for cough and shortness of breath.        History of asthma  Cardiovascular: Negative for chest pain and leg swelling.  Gastrointestinal: Negative for abdominal pain, constipation and diarrhea.  Genitourinary: Positive for pelvic pain and vaginal discharge. Negative for dysuria and frequency.  Musculoskeletal: Negative for myalgias.  Skin: Positive for rash.       Face  Neurological: Negative for dizziness, seizures and headaches.  Psychiatric/Behavioral: The patient is not nervous/anxious.      Physical Exam Triage Vital Signs ED Triage Vitals  Enc Vitals Group     BP 07/05/19 1717 120/73     Pulse Rate 07/05/19 1717 81     Resp 07/05/19 1717 18     Temp 07/05/19 1717 98.4 F (36.9 C)     Temp Source 07/05/19 1717 Oral     SpO2 07/05/19 1717 100 %     Weight --      Height --      Head Circumference --      Peak Flow --      Pain Score 07/05/19 1718 4     Pain Loc --      Pain Edu? --      Excl. in GC? --    No data found.  Updated Vital Signs BP 120/73 (BP Location: Right Arm)   Pulse 81   Temp 98.4 F (36.9 C) (Oral)   Resp 18   LMP 06/16/2019   SpO2 100%     Physical Exam Constitutional:      General: She is not in acute distress.    Appearance: She is well-developed.     Comments: Overweight, protuberant abdomen  HENT:     Head: Normocephalic and atraumatic.     Nose: Nose normal.     Mouth/Throat:     Pharynx: No posterior oropharyngeal erythema.  Eyes:     Conjunctiva/sclera: Conjunctivae normal.  Pupils: Pupils are equal, round, and  reactive to light.  Cardiovascular:     Rate and Rhythm: Normal rate and regular rhythm.     Heart sounds: Normal heart sounds.  Pulmonary:     Effort: Pulmonary effort is normal. No respiratory distress.     Breath sounds: Normal breath sounds.  Abdominal:     General: There is no distension.     Palpations: Abdomen is soft.     Comments: Rounded abdomen.  Soft nontender  Genitourinary:    Comments: Deferred.  Patient denies current genital rash Musculoskeletal:        General: Normal range of motion.     Cervical back: Normal range of motion.  Lymphadenopathy:     Cervical: No cervical adenopathy.  Skin:    General: Skin is warm and dry.     Comments: Skin has multiple comedones, nodules, some hyperpigmented  Neurological:     General: No focal deficit present.     Mental Status: She is alert.  Psychiatric:        Mood and Affect: Mood normal.        Behavior: Behavior normal.      UC Treatments / Results  Labs (all labs ordered are listed, but only abnormal results are displayed) Labs Reviewed  POCT URINALYSIS DIP (DEVICE)  CERVICOVAGINAL ANCILLARY ONLY    EKG   Radiology No results found.  Procedures Procedures (including critical care time)  Medications Ordered in UC Medications  azithromycin (ZITHROMAX) tablet 1,000 mg (1,000 mg Oral Given 07/05/19 1836)  cefTRIAXone (ROCEPHIN) injection 250 mg (250 mg Intramuscular Given 07/05/19 1835)    Initial Impression / Assessment and Plan / UC Course  I have reviewed the triage vital signs and the nursing notes.  Pertinent labs & imaging results that were available during my care of the patient were reviewed by me and considered in my medical decision making (see chart for details).     Dip UA noncontributory. Vaginal swab sent to laboratory. Medicine refills provided. Treatment today for GC/chlamydia/trichomonas/BV/yeast Safe sex is recommended Prescription for acne Final Clinical Impressions(s) / UC  Diagnoses   Final diagnoses:  Possible exposure to STD  High risk heterosexual behavior  History of herpes genitalis  Acne vulgaris  Mild intermittent asthma without complication     Discharge Instructions     I have given you refills of: 1 albuterol inhaler 2 albuterol solution 3 Valtrex  I have treated you for gonorrhea and chlamydia with a shot and pills I have treated you for BV and yeast with metronidazole and Diflucan  You need to follow-up with a primary care doctor We are testing your urine for infection and we will call you if it is positive   ED Prescriptions    Medication Sig Dispense Auth. Provider   metroNIDAZOLE (FLAGYL) 500 MG tablet Take 1 tablet (500 mg total) by mouth 2 (two) times daily. 14 tablet Eustace Moore, MD   fluconazole (DIFLUCAN) 150 MG tablet Take 1 tablet (150 mg total) by mouth daily. Repeat in 1 week if needed 2 tablet Eustace Moore, MD   albuterol (VENTOLIN HFA) 108 (90 Base) MCG/ACT inhaler Inhale 2 puffs into the lungs every 2 (two) hours as needed for wheezing or shortness of breath (cough). 8 g Eustace Moore, MD   albuterol (PROVENTIL) (2.5 MG/3ML) 0.083% nebulizer solution Take 3 mLs (2.5 mg total) by nebulization every 6 (six) hours as needed for wheezing or shortness of breath. 75 mL  Raylene Everts, MD   clindamycin-benzoyl peroxide [facility]) gel Apply topically 2 (two) times daily. 25 g Raylene Everts, MD   valACYclovir (VALTREX) 500 MG tablet Take 1 tablet (500 mg total) by mouth 2 (two) times daily for 5 days. 10 tablet Raylene Everts, MD     PDMP not reviewed this encounter.   Raylene Everts, MD 07/05/19 2010

## 2019-07-06 ENCOUNTER — Telehealth: Payer: Self-pay | Admitting: Emergency Medicine

## 2019-07-06 LAB — CERVICOVAGINAL ANCILLARY ONLY
Chlamydia: POSITIVE — AB
Neisseria Gonorrhea: NEGATIVE
Trichomonas: NEGATIVE

## 2019-07-06 NOTE — Telephone Encounter (Signed)
Chlamydia is positive.  This was treated at the urgent care visit with po zithromax 1g.  Pt needs education to please refrain from sexual intercourse for 7 days to give the medicine time to work.  Sexual partners need to be notified and tested/treated.  Condoms may reduce risk of reinfection.  Recheck or followup with PCP for further evaluation if symptoms are not improving.  [facility] notified.  Attempted to reach patient. No answer at this time. Voicemail left.    

## 2019-07-07 ENCOUNTER — Telehealth: Payer: Self-pay | Admitting: Emergency Medicine

## 2019-07-07 NOTE — Telephone Encounter (Signed)
Attempted to reach patient x2. No answer at this time. Voicemail left.   Letter sent.   

## 2019-10-26 DIAGNOSIS — R109 Unspecified abdominal pain: Secondary | ICD-10-CM | POA: Diagnosis not present

## 2019-10-26 DIAGNOSIS — B9689 Other specified bacterial agents as the cause of diseases classified elsewhere: Secondary | ICD-10-CM | POA: Diagnosis not present

## 2019-10-26 DIAGNOSIS — N760 Acute vaginitis: Secondary | ICD-10-CM | POA: Diagnosis not present

## 2019-10-26 DIAGNOSIS — F17210 Nicotine dependence, cigarettes, uncomplicated: Secondary | ICD-10-CM | POA: Diagnosis not present

## 2019-10-26 DIAGNOSIS — N898 Other specified noninflammatory disorders of vagina: Secondary | ICD-10-CM | POA: Diagnosis not present

## 2019-10-26 DIAGNOSIS — Z3202 Encounter for pregnancy test, result negative: Secondary | ICD-10-CM | POA: Diagnosis not present

## 2020-03-28 DIAGNOSIS — F310 Bipolar disorder, current episode hypomanic: Secondary | ICD-10-CM | POA: Diagnosis not present

## 2020-04-03 ENCOUNTER — Ambulatory Visit (HOSPITAL_COMMUNITY)
Admission: EM | Admit: 2020-04-03 | Discharge: 2020-04-03 | Disposition: A | Discharge status: Home / Self Care | Payer: Medicare Other | Attending: Family Medicine | Admitting: Family Medicine

## 2020-04-03 ENCOUNTER — Other Ambulatory Visit: Payer: Self-pay

## 2020-04-03 ENCOUNTER — Encounter (HOSPITAL_COMMUNITY): Payer: Self-pay | Admitting: Emergency Medicine

## 2020-04-03 DIAGNOSIS — Z113 Encounter for screening for infections with a predominantly sexual mode of transmission: Secondary | ICD-10-CM

## 2020-04-03 DIAGNOSIS — R059 Cough, unspecified: Secondary | ICD-10-CM

## 2020-04-03 DIAGNOSIS — L700 Acne vulgaris: Secondary | ICD-10-CM | POA: Insufficient documentation

## 2020-04-03 DIAGNOSIS — R05 Cough: Secondary | ICD-10-CM | POA: Insufficient documentation

## 2020-04-03 MED ORDER — CEFTRIAXONE SODIUM 500 MG IJ SOLR
500.0000 mg | Freq: Once | INTRAMUSCULAR | Status: AC
  Administered 2020-04-03: 500 mg via INTRAMUSCULAR

## 2020-04-03 MED ORDER — CLINDAMYCIN PHOS-BENZOYL PEROX 1-5 % EX GEL: Freq: Two times a day (BID) | CUTANEOUS | 2 refills | Status: DC

## 2020-04-03 MED ORDER — CEFTRIAXONE SODIUM 500 MG IJ SOLR
INTRAMUSCULAR | Status: AC
  Filled 2020-04-03: qty 500

## 2020-04-03 MED ORDER — DOXYCYCLINE HYCLATE 100 MG PO CAPS: 100.0000 mg | ORAL_CAPSULE | Freq: Two times a day (BID) | ORAL | 0 refills | Status: DC

## 2020-04-03 MED ORDER — LIDOCAINE HCL (PF) 1 % IJ SOLN
INTRAMUSCULAR | Status: AC
  Filled 2020-04-03: qty 2

## 2020-04-03 MED ORDER — BENZONATATE 100 MG PO CAPS: ORAL_CAPSULE | ORAL | 0 refills | Status: DC

## 2020-04-03 NOTE — ED Provider Notes (Signed)
[facility]   [id number] 04/03/20 Arrival Time: 1434  ASSESSMENT & PLAN:  1. Screening for STDs (sexually transmitted diseases)     Meds ordered this encounter  Medications  . cefTRIAXone (ROCEPHIN) injection 500 mg  . clindamycin-benzoyl peroxide (BENZACLIN) gel    Sig: Apply topically 2 (two) times daily.    Dispense:  25 g    Refill:  2  . benzonatate (TESSALON) 100 MG capsule    Sig: Take 1 capsule by mouth every 8 (eight) hours for cough.    Dispense:  21 capsule    Refill:  0  . doxycycline (VIBRAMYCIN) 100 MG capsule    Sig: Take 1 capsule (100 mg total) by mouth 2 (two) times daily.    Dispense:  14 capsule    Refill:  0      Discharge Instructions     You have been given the following today for treatment of suspected gonorrhea and/or chlamydia:  cefTRIAXone (ROCEPHIN) injection 500 mg  Please pick up your prescription for doxycycline 100 mg and begin taking twice daily for the next seven (7) days.  Even though we have treated you today, we have sent testing for sexually transmitted infections. We will notify you of any positive results once they are received. If required, we will prescribe any medications you might need.  Please refrain from all sexual activity for at least the next seven days.      Labs Reviewed  CERVICOVAGINAL ANCILLARY ONLY    Will notify of any positive results. Instructed to refrain from sexual activity for at least seven days.  Reviewed expectations re: course of current medical issues. Questions answered. Outlined signs and symptoms indicating need for more acute intervention. Patient verbalized understanding. After Visit Summary given.   SUBJECTIVE:  Karen Lester is a 34 y.o. female who requests STD screening. Partner with penile discharge. She reports no symptoms. Afebrile. Also with occasional cough; requests cough medicine to use prn; h/o similar in the past. No SOB. Also requests refill of facial acne  medication.  Patient's last menstrual period was 03/30/2020.   OBJECTIVE:  Vitals:   04/03/20 1520  BP: 106/78  Pulse: 67  Resp: 15  Temp: 98.2 F (36.8 C)  TempSrc: Oral  SpO2: 100%     General appearance: alert, cooperative, appears stated age and no distress Lungs: unlabored respirations; speaks full sentences without difficulty Back: no CVA tenderness; FROM at waist Abdomen: soft, non-tender GU: deferred Skin: warm and dry Psychological: alert and cooperative; normal mood and affect.    Labs Reviewed  CERVICOVAGINAL ANCILLARY ONLY    No Known Allergies  Past Medical History:  Diagnosis Date  . Abnormal Pap smear    had colpo  . ADHD (attention deficit hyperactivity disorder)   . Anxiety   . Asthma   . Bipolar 1 disorder (HCC)   . Depression    doing fine, not currently on meds  . Herpes   . Urinary tract infection   . Vaginal Pap smear, abnormal    Family History  Problem Relation Age of Onset  . Hypertension Mother   . Mental retardation Brother        down's syndrome  . Asthma Son    Social History   Socioeconomic History  . Marital status: Single    Spouse name: Not on file  . Number of children: 5  . Years of education: Not on file  . Highest education level: Not on file  Occupational History  .  Not on file  Tobacco Use  . Smoking status: Current Every Day Smoker    Packs/day: 0.25    Years: 16.00    Pack years: 4.00    Types: Cigarettes    Last attempt to quit: 06/13/2013    Years since quitting: 6.8  . Smokeless tobacco: Never Used  Vaping Use  . Vaping Use: Never used  Substance and Sexual Activity  . Alcohol use: No    Comment: OCcas.  . Drug use: Yes    Types: Marijuana  . Sexual activity: Yes    Birth control/protection: Surgical  Other Topics Concern  . Not on file  Social History Narrative  . Not on file   Social Determinants of Health   Financial Resource Strain:   . Difficulty of Paying Living Expenses: Not on  file  Food Insecurity:   . Worried About Programme researcher, broadcasting/film/video in the Last Year: Not on file  . Ran Out of Food in the Last Year: Not on file  Transportation Needs:   . Lack of Transportation (Medical): Not on file  . Lack of Transportation (Non-Medical): Not on file  Physical Activity:   . Days of Exercise per Week: Not on file  . Minutes of Exercise per Session: Not on file  Stress:   . Feeling of Stress : Not on file  Social Connections:   . Frequency of Communication with Friends and Family: Not on file  . Frequency of Social Gatherings with Friends and Family: Not on file  . Attends Religious Services: Not on file  . Active Member of Clubs or Organizations: Not on file  . Attends Banker Meetings: Not on file  . Marital Status: Not on file  Intimate Partner Violence:   . Fear of Current or Ex-Partner: Not on file  . Emotionally Abused: Not on file  . Physically Abused: Not on file  . Sexually Abused: Not on file          Mardella Layman, MD 04/03/20 [phone]

## 2020-04-03 NOTE — ED Triage Notes (Signed)
Pt states her partner is being seen because he is having penile discharge and he wants to be checked for an STD. She states she is asymptomatic. She would like to be treated.

## 2020-04-03 NOTE — Discharge Instructions (Signed)

## 2020-04-04 LAB — CERVICOVAGINAL ANCILLARY ONLY
Bacterial Vaginitis (gardnerella): POSITIVE — AB
Candida Glabrata: NEGATIVE
Candida Vaginitis: NEGATIVE
Chlamydia: NEGATIVE
Comment: NEGATIVE
Comment: NEGATIVE
Comment: NEGATIVE
Comment: NEGATIVE
Comment: NEGATIVE
Comment: NORMAL
Neisseria Gonorrhea: POSITIVE — AB
Trichomonas: POSITIVE — AB

## 2020-04-05 ENCOUNTER — Telehealth (HOSPITAL_COMMUNITY): Payer: Self-pay | Admitting: Emergency Medicine

## 2020-04-05 MED ORDER — METRONIDAZOLE 500 MG PO TABS: 500.0000 mg | ORAL_TABLET | Freq: Two times a day (BID) | ORAL | 0 refills | Status: DC

## 2020-05-23 DIAGNOSIS — F310 Bipolar disorder, current episode hypomanic: Secondary | ICD-10-CM | POA: Diagnosis not present

## 2020-05-23 DIAGNOSIS — F419 Anxiety disorder, unspecified: Secondary | ICD-10-CM | POA: Diagnosis not present

## 2020-07-06 ENCOUNTER — Emergency Department (HOSPITAL_COMMUNITY)
Admission: EM | Admit: 2020-07-06 | Discharge: 2020-07-07 | Disposition: A | Discharge status: Home / Self Care | Payer: Medicare Other | Attending: Emergency Medicine | Admitting: Emergency Medicine

## 2020-07-06 ENCOUNTER — Emergency Department (HOSPITAL_COMMUNITY): Payer: Medicare Other

## 2020-07-06 ENCOUNTER — Other Ambulatory Visit: Payer: Self-pay

## 2020-07-06 DIAGNOSIS — Y92410 Unspecified street and highway as the place of occurrence of the external cause: Secondary | ICD-10-CM | POA: Diagnosis not present

## 2020-07-06 DIAGNOSIS — R52 Pain, unspecified: Secondary | ICD-10-CM | POA: Diagnosis not present

## 2020-07-06 DIAGNOSIS — F17210 Nicotine dependence, cigarettes, uncomplicated: Secondary | ICD-10-CM | POA: Diagnosis not present

## 2020-07-06 DIAGNOSIS — Z041 Encounter for examination and observation following transport accident: Secondary | ICD-10-CM | POA: Diagnosis not present

## 2020-07-06 DIAGNOSIS — M25522 Pain in left elbow: Secondary | ICD-10-CM

## 2020-07-06 DIAGNOSIS — S50312A Abrasion of left elbow, initial encounter: Secondary | ICD-10-CM | POA: Diagnosis not present

## 2020-07-06 DIAGNOSIS — S59902A Unspecified injury of left elbow, initial encounter: Secondary | ICD-10-CM | POA: Diagnosis present

## 2020-07-06 DIAGNOSIS — J45909 Unspecified asthma, uncomplicated: Secondary | ICD-10-CM | POA: Insufficient documentation
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
## 2020-07-06 NOTE — Discharge Instructions (Addendum)
I recommend a combination of tylenol and ibuprofen for management of your pain. You can take a low dose of both at the same time. I recommend 325 mg of Tylenol combined with 400 mg of ibuprofen. This is one regular Tylenol and two regular ibuprofen. You can take these 2-3 times for day for your pain. Please try to take these medications with a small amount of food as well to prevent upsetting your stomach.  Also, please consider topical pain relieving creams such as Voltaran Gel, BioFreeze, or Icy Hot. There is also a pain relieving cream made by [employer]. You should be able to find all of these at your local pharmacy.   I have given you an orthopedic referral if you find your symptoms are not improving. His contact information is below.  Please return to the emergency department if your symptoms worsen. It was a pleasure to meet you.
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
## 2020-09-01 DIAGNOSIS — Z202 Contact with and (suspected) exposure to infections with a predominantly sexual mode of transmission: Secondary | ICD-10-CM | POA: Diagnosis not present

## 2020-09-01 DIAGNOSIS — N898 Other specified noninflammatory disorders of vagina: Secondary | ICD-10-CM | POA: Diagnosis not present

## 2020-09-01 DIAGNOSIS — Z3202 Encounter for pregnancy test, result negative: Secondary | ICD-10-CM | POA: Diagnosis not present

## 2020-10-12 DIAGNOSIS — B9620 Unspecified Escherichia coli [E. coli] as the cause of diseases classified elsewhere: Secondary | ICD-10-CM | POA: Diagnosis not present

## 2020-10-12 DIAGNOSIS — D72829 Elevated white blood cell count, unspecified: Secondary | ICD-10-CM | POA: Diagnosis not present

## 2020-10-12 DIAGNOSIS — R319 Hematuria, unspecified: Secondary | ICD-10-CM | POA: Diagnosis not present

## 2020-10-12 DIAGNOSIS — N3090 Cystitis, unspecified without hematuria: Secondary | ICD-10-CM | POA: Diagnosis not present

## 2020-10-12 DIAGNOSIS — E876 Hypokalemia: Secondary | ICD-10-CM | POA: Diagnosis not present

## 2020-10-12 DIAGNOSIS — Z3202 Encounter for pregnancy test, result negative: Secondary | ICD-10-CM | POA: Diagnosis not present

## 2020-10-12 DIAGNOSIS — Z8742 Personal history of other diseases of the female genital tract: Secondary | ICD-10-CM | POA: Diagnosis not present

## 2020-10-12 DIAGNOSIS — Z9851 Tubal ligation status: Secondary | ICD-10-CM | POA: Diagnosis not present

## 2020-10-12 DIAGNOSIS — R109 Unspecified abdominal pain: Secondary | ICD-10-CM | POA: Diagnosis not present

## 2021-02-04 ENCOUNTER — Encounter (HOSPITAL_BASED_OUTPATIENT_CLINIC_OR_DEPARTMENT_OTHER): Payer: Self-pay | Admitting: Urology

## 2021-02-04 ENCOUNTER — Emergency Department (HOSPITAL_BASED_OUTPATIENT_CLINIC_OR_DEPARTMENT_OTHER)
Admission: EM | Admit: 2021-02-04 | Discharge: 2021-02-05 | Disposition: A | Discharge status: Home / Self Care | Payer: Medicare Other | Source: Home / Self Care | Attending: Emergency Medicine | Admitting: Emergency Medicine

## 2021-02-04 ENCOUNTER — Emergency Department (HOSPITAL_BASED_OUTPATIENT_CLINIC_OR_DEPARTMENT_OTHER)
Admission: EM | Admit: 2021-02-04 | Discharge: 2021-02-04 | Disposition: A | Discharge status: Home / Self Care | Payer: Medicare Other | Attending: Emergency Medicine | Admitting: Emergency Medicine

## 2021-02-04 ENCOUNTER — Other Ambulatory Visit: Payer: Self-pay

## 2021-02-04 DIAGNOSIS — J45909 Unspecified asthma, uncomplicated: Secondary | ICD-10-CM | POA: Insufficient documentation

## 2021-02-04 DIAGNOSIS — Z202 Contact with and (suspected) exposure to infections with a predominantly sexual mode of transmission: Secondary | ICD-10-CM | POA: Insufficient documentation

## 2021-02-04 DIAGNOSIS — N898 Other specified noninflammatory disorders of vagina: Secondary | ICD-10-CM | POA: Diagnosis not present

## 2021-02-04 DIAGNOSIS — M549 Dorsalgia, unspecified: Secondary | ICD-10-CM | POA: Insufficient documentation

## 2021-02-04 DIAGNOSIS — F17210 Nicotine dependence, cigarettes, uncomplicated: Secondary | ICD-10-CM | POA: Insufficient documentation

## 2021-02-04 DIAGNOSIS — Z113 Encounter for screening for infections with a predominantly sexual mode of transmission: Secondary | ICD-10-CM | POA: Insufficient documentation

## 2021-02-04 DIAGNOSIS — Z5321 Procedure and treatment not carried out due to patient leaving prior to being seen by health care provider: Secondary | ICD-10-CM | POA: Insufficient documentation

## 2021-02-04 DIAGNOSIS — Z711 Person with feared health complaint in whom no diagnosis is made: Secondary | ICD-10-CM

## 2021-02-04 LAB — URINALYSIS, ROUTINE W REFLEX MICROSCOPIC
Bilirubin Urine: NEGATIVE
Glucose, UA: NEGATIVE mg/dL
Hgb urine dipstick: NEGATIVE
Ketones, ur: NEGATIVE mg/dL
Nitrite: NEGATIVE
Protein, ur: NEGATIVE mg/dL
Specific Gravity, Urine: 1.02 (ref 1.005–1.030)
pH: 6.5 (ref 5.0–8.0)

## 2021-02-04 LAB — URINALYSIS, MICROSCOPIC (REFLEX)

## 2021-02-04 LAB — PREGNANCY, URINE: Preg Test, Ur: NEGATIVE

## 2021-02-04 NOTE — ED Notes (Signed)
Called for x 2 in waiting room with no answer ?

## 2021-02-04 NOTE — ED Triage Notes (Signed)
Foul urine odor and vaginal odor, denies any vaginal discharge, states "wants STD check".

## 2021-02-04 NOTE — ED Triage Notes (Signed)
Foul urine odor and vaginal odor, denies any vaginal discharge, states "wants STD check".           

## 2021-02-04 NOTE — ED Notes (Signed)
Called for x 3 in waiting room with no answer 

## 2021-02-05 DIAGNOSIS — Z113 Encounter for screening for infections with a predominantly sexual mode of transmission: Secondary | ICD-10-CM | POA: Diagnosis not present

## 2021-02-05 DIAGNOSIS — Z202 Contact with and (suspected) exposure to infections with a predominantly sexual mode of transmission: Secondary | ICD-10-CM | POA: Diagnosis not present

## 2021-02-05 LAB — WET PREP, GENITAL
Clue Cells Wet Prep HPF POC: NONE SEEN
Sperm: NONE SEEN
Trich, Wet Prep: NONE SEEN
Yeast Wet Prep HPF POC: NONE SEEN

## 2021-02-05 MED ORDER — CEFTRIAXONE SODIUM 500 MG IJ SOLR
500.0000 mg | Freq: Once | INTRAMUSCULAR | Status: AC
  Administered 2021-02-05: 500 mg via INTRAMUSCULAR
  Filled 2021-02-05: qty 500

## 2021-02-05 MED ORDER — DOXYCYCLINE HYCLATE 100 MG PO TABS
100.0000 mg | ORAL_TABLET | Freq: Once | ORAL | Status: AC
  Administered 2021-02-05: 100 mg via ORAL
  Filled 2021-02-05: qty 1

## 2021-02-05 MED ORDER — DOXYCYCLINE HYCLATE 100 MG PO CAPS: 100.0000 mg | ORAL_CAPSULE | Freq: Two times a day (BID) | ORAL | 0 refills | Status: DC

## 2021-02-05 NOTE — Discharge Instructions (Addendum)
You were seen today in tested for STDs.  Your wet prep is normal.  Gonorrhea and Chlamydia testing are pending.  You will be called if either of these is positive.  In the meantime, take your antibiotics as prescribed.  Abstain from sexual activity for the next 10 days.

## 2021-02-05 NOTE — ED Provider Notes (Signed)
[facility] EMERGENCY DEPARTMENT Provider Note   CSN: [id number] Arrival date & time: 02/04/21  2138     History Chief Complaint  Patient presents with   SEXUALLY TRANSMITTED DISEASE    Karen Lester is a 35 y.o. female.  HPI     This is a 35 year old female with a history of asthma, bipolar disorder who presents requesting STD check.  She states that her partner is here requesting the same.  Neither one of them are symptomatic.  She states that she only has 1 sexual partner and they no longer use condoms.  However, she is unsure whether he has additional sexual partners.  She states "I just wanted to get it checked out."  She does report some chronic "kidney problems."  She has left-sided back pain.  No fevers.  No dysuria.  No vaginal discharge.  She does not believe herself to be pregnant.  Patient presented earlier yesterday and had urinalysis and urine pregnancy.  I have reviewed these.  Urine pregnancy negative.  Urinalysis without evidence of UTI  Past Medical History:  Diagnosis Date   Abnormal Pap smear    had colpo   ADHD (attention deficit hyperactivity disorder)    Anxiety    Asthma    Bipolar 1 disorder (HCC)    Depression    doing fine, not currently on meds   Herpes    Urinary tract infection    Vaginal Pap smear, abnormal     Patient Active Problem List   Diagnosis Date Noted   Pregnancy 08/03/2013   Bipolar 1 disorder (HCC) 02/24/2012    Class: Chronic   ADHD (attention deficit hyperactivity disorder) 02/24/2012    Class: Chronic   Polysubstance abuse (HCC) 02/24/2012    Class: Chronic    Past Surgical History:  Procedure Laterality Date   COLPOSCOPY     INDUCED ABORTION     TUBAL LIGATION Bilateral 08/04/2013   Procedure: POST PARTUM TUBAL LIGATION;  Surgeon: Lesly Dukes, MD;  Location: [facility];  Service: Gynecology;  Laterality: Bilateral;     OB History     Gravida  6   Para  5   Term  5   Preterm      AB  1    Living  5      SAB      IAB  1   Ectopic      Multiple      Live Births  5           Family History  Problem Relation Age of Onset   Hypertension Mother    Mental retardation Brother        down's syndrome   Asthma Son     Social History   Tobacco Use   Smoking status: Every Day    Packs/day: 0.25    Years: 16.00    Pack years: 4.00    Types: Cigarettes    Last attempt to quit: 06/13/2013    Years since quitting: 7.6   Smokeless tobacco: Never  Vaping Use   Vaping Use: Never used  Substance Use Topics   Alcohol use: No    Comment: OCcas.   Drug use: Yes    Types: Marijuana    Home Medications Prior to Admission medications   Medication Sig Start Date End Date Taking? Authorizing Provider  doxycycline (VIBRAMYCIN) 100 MG capsule Take 1 capsule (100 mg total) by mouth 2 (two) times daily. 02/05/21  Yes Arriyanna Mersch, Toni Amend  F, MD  albuterol (PROVENTIL) (2.5 MG/3ML) 0.083% nebulizer solution Take 3 mLs (2.5 mg total) by nebulization every 6 (six) hours as needed for wheezing or shortness of breath. 07/05/19   Eustace Moore, MD  albuterol (VENTOLIN HFA) 108 (90 Base) MCG/ACT inhaler Inhale 2 puffs into the lungs every 2 (two) hours as needed for wheezing or shortness of breath (cough). 07/05/19   Eustace Moore, MD  Hydrocod Polst-Chlorphen Polst [facility] ER PO) Take 5 mLs by mouth 2 (two) times daily as needed (cough).    [provider]  methylphenidate 36 MG PO CR tablet Take 36 mg by mouth daily.    [provider]  metroNIDAZOLE (FLAGYL) 500 MG tablet Take 1 tablet (500 mg total) by mouth 2 (two) times daily. Patient not taking: No sig reported 04/05/20   Lamptey, Britta Mccreedy, MD    Allergies    Patient has no known allergies.  Review of Systems   Review of Systems  Constitutional:  Negative for fever.  Respiratory:  Negative for shortness of breath.   Cardiovascular:  Negative for chest pain.  Gastrointestinal:   Negative for abdominal pain, diarrhea, nausea and vomiting.  Genitourinary:  Negative for dysuria and vaginal discharge.  Musculoskeletal:  Positive for back pain.  All other systems reviewed and are negative.  Physical Exam Updated Vital Signs BP 100/68 (BP Location: Left Arm)   Pulse 60   Temp 98.4 F (36.9 C) (Oral)   Resp 18   Ht 1.651 m (5\' 5" )   Wt 82.1 kg   SpO2 95%   BMI 30.12 kg/m   Physical Exam Vitals and nursing note reviewed.  Constitutional:      Appearance: She is well-developed. She is not ill-appearing.  HENT:     Head: Normocephalic and atraumatic.     Nose: Nose normal.     Mouth/Throat:     Mouth: Mucous membranes are moist.  Eyes:     Pupils: Pupils are equal, round, and reactive to light.  Cardiovascular:     Rate and Rhythm: Normal rate and regular rhythm.     Heart sounds: Normal heart sounds.  Pulmonary:     Effort: Pulmonary effort is normal. No respiratory distress.     Breath sounds: No wheezing.  Abdominal:     General: Bowel sounds are normal.     Palpations: Abdomen is soft.     Tenderness: There is no right CVA tenderness or left CVA tenderness.  Musculoskeletal:     Cervical back: Neck supple.  Skin:    General: Skin is warm and dry.  Neurological:     Mental Status: She is alert and oriented to person, place, and time.  Psychiatric:        Mood and Affect: Mood normal.    ED Results / Procedures / Treatments   Labs (all labs ordered are listed, but only abnormal results are displayed) Labs Reviewed  WET PREP, GENITAL - Abnormal; Notable for the following components:      Result Value   WBC, Wet Prep HPF POC FEW (*)    All other components within normal limits  GC/CHLAMYDIA PROBE AMP ([place]) NOT AT [facility]    EKG None  Radiology No results found.  Procedures Procedures   Medications Ordered in ED Medications  cefTRIAXone (ROCEPHIN) injection 500 mg (500 mg Intramuscular Given 02/05/21 0045)  doxycycline  (VIBRA-TABS) tablet 100 mg (100 mg Oral Given 02/05/21 0044)    ED Course  I  have reviewed the triage vital signs and the nursing notes.  Pertinent labs & imaging results that were available during my care of the patient were reviewed by me and considered in my medical decision making (see chart for details).    MDM Rules/Calculators/A&P                           Patient presents requesting STD evaluation.  She reports that she is asymptomatic although she reported foul-smelling odor to triage nurse.  She also reports chronic "kidney issues."  Patient elected to self swab.  She is not having any abdominal pain.  Pelvic exam was deferred.  She is mostly asymptomatic.  No CVA tenderness or fever to suggest pyelonephritis.  Urinalysis without obvious UTI.  She is not pregnant.  Wet prep did not show any trichomonas.  GC and Chlamydia testing are pending.  She was treated and counseled regarding safe sex activity.  Recommended abstinence for the next 10 days.  After history, exam, and medical workup I feel the patient has been appropriately medically screened and is safe for discharge home. Pertinent diagnoses were discussed with the patient. Patient was given return precautions.  Final Clinical Impression(s) / ED Diagnoses Final diagnoses:  Concern about STD in female without diagnosis    Rx / DC Orders ED Discharge Orders          Ordered    doxycycline (VIBRAMYCIN) 100 MG capsule  2 times daily        02/05/21 0151             Allyana Vogan, Mayer Masker, MD 02/05/21 [phone]

## 2021-02-06 LAB — GC/CHLAMYDIA PROBE AMP (~~LOC~~) NOT AT ARMC
Chlamydia: NEGATIVE
Comment: NEGATIVE
Comment: NORMAL
Neisseria Gonorrhea: NEGATIVE

## 2021-03-17 DIAGNOSIS — T192XXA Foreign body in vulva and vagina, initial encounter: Secondary | ICD-10-CM | POA: Diagnosis not present

## 2021-03-17 DIAGNOSIS — N939 Abnormal uterine and vaginal bleeding, unspecified: Secondary | ICD-10-CM | POA: Diagnosis not present

## 2021-03-17 DIAGNOSIS — Z3202 Encounter for pregnancy test, result negative: Secondary | ICD-10-CM | POA: Diagnosis not present

## 2021-03-17 DIAGNOSIS — D649 Anemia, unspecified: Secondary | ICD-10-CM | POA: Diagnosis not present

## 2021-03-17 DIAGNOSIS — N898 Other specified noninflammatory disorders of vagina: Secondary | ICD-10-CM | POA: Diagnosis not present

## 2021-03-17 DIAGNOSIS — Z5320 Procedure and treatment not carried out because of patient's decision for unspecified reasons: Secondary | ICD-10-CM | POA: Diagnosis not present

## 2021-03-17 DIAGNOSIS — N3000 Acute cystitis without hematuria: Secondary | ICD-10-CM | POA: Diagnosis not present

## 2021-03-20 ENCOUNTER — Telehealth: Payer: Self-pay | Admitting: *Deleted

## 2021-03-20 NOTE — Patient Outreach (Signed)
  [facility]) Care Management  [facility] Social Work  03/20/2021  Karen Lester 1985/12/12 [id number]  Subjective:  Assess for needs; frequent ED visits  Objective: CSW spoke with pt by phone today and confirmed pt identity. CSW introduced self, role and reason for call. Pt reports she was recently in the ER "because I had a tampon stuck in me".  She also reports being in the ER in July "for kidneys".  CSW inquired about a PCP; she plans to call the [facility] and have them become her PCP.  Pt states she has Medicare and Medicaid. No issues related to transportation (drives and has a car), or food, RX,etc. Pt shared with CSW she is seen monthly at [facility] for Bipolar Disorder, anxiety and ADHD. She denies any concerns or needs related to her health or mental health.   CSW offered pt the # for PCP referral support through [facility] but she plans to follow up with [facility]; encouraged her to reach out to them as soon as she can to get connected and an initial appointment scheduled.  CSW will follow up with pt in the next month for updates on above.   Encounter Medications:  Outpatient Encounter Medications as of 03/20/2021  Medication Sig   albuterol (PROVENTIL) (2.5 MG/3ML) 0.083% nebulizer solution Take 3 mLs (2.5 mg total) by nebulization every 6 (six) hours as needed for wheezing or shortness of breath.   albuterol (VENTOLIN HFA) 108 (90 Base) MCG/ACT inhaler Inhale 2 puffs into the lungs every 2 (two) hours as needed for wheezing or shortness of breath (cough).   doxycycline (VIBRAMYCIN) 100 MG capsule Take 1 capsule (100 mg total) by mouth 2 (two) times daily.   Hydrocod Polst-Chlorphen Polst (TUSSIONEX PENNKINETIC ER PO) Take 5 mLs by mouth 2 (two) times daily as needed (cough).   methylphenidate 36 MG PO CR tablet Take 36 mg by mouth daily.   metroNIDAZOLE (FLAGYL) 500 MG tablet Take 1 tablet (500 mg total) by mouth 2 (two) times daily. (Patient not taking: No sig  reported)   No facility-administered encounter medications on file as of 03/20/2021.     Plan:  Follow-up:  Follow-up in 1 month(s)  [name], MSW, LCSW Clinical Social Worker  [facility] [phone]

## 2021-03-20 NOTE — Telephone Encounter (Addendum)
error 

## 2021-04-19 ENCOUNTER — Other Ambulatory Visit: Payer: Self-pay | Admitting: *Deleted

## 2021-04-19 NOTE — Patient Outreach (Signed)
[facility]) Care Management  04/19/2021  Karen Lester Feb 16, 1986 [id number]  CSW made contact with pt who reports she has not been able to connect with a PCP. CSW provided pt with contact # for PCP referral line.  Pt shared she thinks she has bronchitis and needs to go see a PCP. CSW advised her to call and see when the earliest visit available may be but also alerted her to it not being today or soon and thus the importance of her going to an Urgent Care if needed and not to the ER for possible bronchitis; reiterating that the ER is for true emergencies and she would be better/faster served at Urgent Care and/or PCP. Encouraged pt to call asap to get connected with PCP.  Pt says her depression is ok; she has not been back to [place] since July- CSW stressed the importance of her staying connected with them as recommended by their clinician(s).   CSW offered support and will follow up in 4-6 weeks.   [name], MSW, LCSW Clinical Social Worker  [facility] [phone]

## 2021-05-20 ENCOUNTER — Other Ambulatory Visit: Payer: Self-pay | Admitting: *Deleted

## 2021-05-20 NOTE — Patient Outreach (Signed)
[facility]) Care Management  05/20/2021  Karen Lester Jul 27, 1985 [id number]   CSW spoke with pt who reports she has gotten connected with a new PCP- [facility], and will have her initial visit in December. She also reports going to the ER in [facility] due to a kidney infection; "They have me IV fluids for 5 hours".  Reminded pt the importance of getting established with her new PCP next month so she can go through them in the future to get things arranged.   Pt has not reconnected with [facility] for her monthly mental health visits; CSW strongly encouraged to her that she make an appointment asap- pt plans to call today.   [name], MSW, LCSW Clinical Social Worker  [facility] [phone]

## 2021-06-24 ENCOUNTER — Ambulatory Visit: Payer: Self-pay | Admitting: *Deleted

## 2021-06-25 ENCOUNTER — Telehealth: Payer: Self-pay | Admitting: *Deleted

## 2021-06-25 NOTE — Patient Outreach (Signed)
[facility]) Care Management  06/25/2021  CARNELIA OSCAR [birth year] [id number]   Spoke briefly with pt on 06/24/21 who asked if she could call back later- will await her callback or try again in 2-3 weeks.  [name], MSW, LCSW Clinical Social Worker  [facility] [phone]

## 2021-07-01 ENCOUNTER — Emergency Department (HOSPITAL_COMMUNITY)
Admission: EM | Admit: 2021-07-01 | Discharge: 2021-07-01 | Disposition: A | Discharge status: Home / Self Care | Payer: Medicare Other | Attending: Emergency Medicine | Admitting: Emergency Medicine

## 2021-07-01 ENCOUNTER — Other Ambulatory Visit: Payer: Self-pay

## 2021-07-01 ENCOUNTER — Emergency Department (HOSPITAL_COMMUNITY)
Admission: EM | Admit: 2021-07-01 | Discharge: 2021-07-01 | Disposition: A | Discharge status: Home / Self Care | Payer: Medicare Other

## 2021-07-01 DIAGNOSIS — Y92410 Unspecified street and highway as the place of occurrence of the external cause: Secondary | ICD-10-CM | POA: Insufficient documentation

## 2021-07-01 DIAGNOSIS — M25562 Pain in left knee: Secondary | ICD-10-CM | POA: Diagnosis not present

## 2021-07-01 DIAGNOSIS — M542 Cervicalgia: Secondary | ICD-10-CM | POA: Insufficient documentation

## 2021-07-01 DIAGNOSIS — Z5321 Procedure and treatment not carried out due to patient leaving prior to being seen by health care provider: Secondary | ICD-10-CM | POA: Insufficient documentation

## 2021-07-01 DIAGNOSIS — M25522 Pain in left elbow: Secondary | ICD-10-CM | POA: Diagnosis not present

## 2021-07-01 DIAGNOSIS — M79662 Pain in left lower leg: Secondary | ICD-10-CM | POA: Insufficient documentation
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
## 2021-07-01 NOTE — ED Notes (Signed)
Pt no call for Triage at [facility], per registration pt at [facility].

## 2021-07-09 ENCOUNTER — Ambulatory Visit: Payer: Self-pay | Admitting: *Deleted

## 2021-07-10 ENCOUNTER — Telehealth: Payer: Self-pay | Admitting: *Deleted

## 2021-07-10 NOTE — Patient Outreach (Signed)
[facility]) Care Management  07/10/2021  Karen Lester 1985-12-18 [id number]   CSW made a second attempt to try and follow up with patient without success.  No voicemail offered. CSW will try again in 10-14 days if no return call is received and will send an unsuccessful outreach letter. [name], MSW, LCSW Clinical Social Worker  [facility] [phone]

## 2021-07-10 NOTE — Patient Outreach (Signed)
[facility]) Care Management  07/10/2021  Karen Lester 1985-07-19 [id number]  CSW made an attempt to follow up with patient today without success.  No voicemail offered.  CSW will try again in 7-10 days if no return call is received.   [name], MSW, LCSW Clinical Social Worker  [facility] [phone]

## 2021-07-16 DIAGNOSIS — U071 COVID-19: Secondary | ICD-10-CM | POA: Diagnosis not present

## 2021-07-16 DIAGNOSIS — Z28310 Unvaccinated for covid-19: Secondary | ICD-10-CM | POA: Diagnosis not present

## 2021-07-16 DIAGNOSIS — R059 Cough, unspecified: Secondary | ICD-10-CM | POA: Diagnosis not present

## 2021-07-16 DIAGNOSIS — R0981 Nasal congestion: Secondary | ICD-10-CM | POA: Diagnosis not present

## 2021-07-18 ENCOUNTER — Telehealth: Payer: Self-pay | Admitting: *Deleted

## 2021-07-18 ENCOUNTER — Ambulatory Visit: Payer: Self-pay | Admitting: *Deleted

## 2021-07-18 NOTE — Patient Outreach (Signed)
[facility]) Care Management  07/18/2021  NYLIA GAVINA 12-10-1985 [id number]   CSW  made a third/final follow up call attempt without success.  CSW will mail pt a closure letter and plan to sign off.   [name], MSW, LCSW Clinical Social Worker  [facility] [phone]

## 2022-03-03 ENCOUNTER — Other Ambulatory Visit: Payer: Self-pay

## 2022-03-03 ENCOUNTER — Emergency Department (HOSPITAL_BASED_OUTPATIENT_CLINIC_OR_DEPARTMENT_OTHER)
Admission: EM | Admit: 2022-03-03 | Discharge: 2022-03-03 | Disposition: A | Discharge status: Home / Self Care | Payer: Medicare Other | Attending: Emergency Medicine | Admitting: Emergency Medicine

## 2022-03-03 ENCOUNTER — Encounter (HOSPITAL_BASED_OUTPATIENT_CLINIC_OR_DEPARTMENT_OTHER): Payer: Self-pay | Admitting: Emergency Medicine

## 2022-03-03 DIAGNOSIS — M545 Low back pain, unspecified: Secondary | ICD-10-CM | POA: Diagnosis present

## 2022-03-03 DIAGNOSIS — Y990 Civilian activity done for income or pay: Secondary | ICD-10-CM | POA: Diagnosis not present

## 2022-03-03 DIAGNOSIS — X500XXA Overexertion from strenuous movement or load, initial encounter: Secondary | ICD-10-CM | POA: Insufficient documentation

## 2022-03-03 LAB — URINALYSIS, ROUTINE W REFLEX MICROSCOPIC
Bilirubin Urine: NEGATIVE
Glucose, UA: NEGATIVE mg/dL
Hgb urine dipstick: NEGATIVE
Ketones, ur: NEGATIVE mg/dL
Leukocytes,Ua: NEGATIVE
Nitrite: NEGATIVE
Protein, ur: NEGATIVE mg/dL
Specific Gravity, Urine: 1.025 (ref 1.005–1.030)
pH: 6 (ref 5.0–8.0)

## 2022-03-03 LAB — PREGNANCY, URINE: Preg Test, Ur: NEGATIVE

## 2022-03-03 MED ORDER — LIDOCAINE 5 % EX PTCH
1.0000 | MEDICATED_PATCH | CUTANEOUS | Status: DC
  Administered 2022-03-03: 1 via TRANSDERMAL
  Filled 2022-03-03: qty 1

## 2022-03-03 MED ORDER — LIDOCAINE 5 % EX PTCH: 1.0000 | MEDICATED_PATCH | CUTANEOUS | 0 refills | Status: DC

## 2022-03-03 MED ORDER — METHOCARBAMOL 500 MG PO TABS
1000.0000 mg | ORAL_TABLET | Freq: Once | ORAL | Status: AC
  Administered 2022-03-03: 1000 mg via ORAL
  Filled 2022-03-03: qty 2

## 2022-03-03 MED ORDER — NAPROXEN 250 MG PO TABS
500.0000 mg | ORAL_TABLET | Freq: Once | ORAL | Status: AC
  Administered 2022-03-03: 500 mg via ORAL
  Filled 2022-03-03: qty 2

## 2022-03-03 MED ORDER — METHOCARBAMOL 500 MG PO TABS: 500.0000 mg | ORAL_TABLET | Freq: Two times a day (BID) | ORAL | 0 refills | Status: DC

## 2022-03-03 MED ORDER — NAPROXEN 500 MG PO TABS: 500.0000 mg | ORAL_TABLET | Freq: Two times a day (BID) | ORAL | 0 refills | Status: DC

## 2022-03-03 NOTE — ED Provider Notes (Signed)
[facility] EMERGENCY DEPARTMENT Provider Note   CSN: [id number] Arrival date & time: 03/03/22  1821     History  Chief Complaint  Patient presents with   Back Pain    Karen Lester is a 36 y.o. female presenting today with back pain.  Reports its been going on for "a while."  Says that she lifts heavy boxes at work which makes her pain worse.  It resolves when she lays down and rest.  Tylenol only helps a little bit, has not tried any NSAIDs.  No urinary symptoms, fevers, chills, nausea or vomiting.  No leg weakness, saddle anesthesia, bowel/bladder dysfunction and denies IVDU    Back Pain Associated symptoms: no dysuria, no fever, no numbness, no pelvic pain and no weakness        Home Medications Prior to Admission medications   Medication Sig Start Date End Date Taking? Authorizing Provider  albuterol (PROVENTIL) (2.5 MG/3ML) 0.083% nebulizer solution Take 3 mLs (2.5 mg total) by nebulization every 6 (six) hours as needed for wheezing or shortness of breath. 07/05/19   Eustace Moore, MD  albuterol (VENTOLIN HFA) 108 (90 Base) MCG/ACT inhaler Inhale 2 puffs into the lungs every 2 (two) hours as needed for wheezing or shortness of breath (cough). 07/05/19   Eustace Moore, MD  doxycycline (VIBRAMYCIN) 100 MG capsule Take 1 capsule (100 mg total) by mouth 2 (two) times daily. 02/05/21   Horton, Mayer Masker, MD  Hydrocod Polst-Chlorphen Polst [facility] ER PO) Take 5 mLs by mouth 2 (two) times daily as needed (cough).    [provider]  methylphenidate 36 MG PO CR tablet Take 36 mg by mouth daily.    [provider]  metroNIDAZOLE (FLAGYL) 500 MG tablet Take 1 tablet (500 mg total) by mouth 2 (two) times daily. Patient not taking: No sig reported 04/05/20   Lamptey, Britta Mccreedy, MD      Allergies    Patient has no known allergies.    Review of Systems   Review of Systems  Constitutional:  Negative for chills and fever.   Genitourinary:  Negative for decreased urine volume, dysuria, hematuria, pelvic pain, vaginal bleeding, vaginal discharge and vaginal pain.  Musculoskeletal:  Positive for back pain and myalgias.  Neurological:  Negative for weakness and numbness.    Physical Exam Updated Vital Signs BP (!) 120/96 (BP Location: Right Arm)   Pulse 69   Temp 98.3 F (36.8 C) (Oral)   Resp 18   Ht 5\' 5"  (1.651 m)   Wt 82.1 kg   LMP 01/25/2022 (Approximate)   SpO2 97%   BMI 30.12 kg/m  Physical Exam Vitals and nursing note reviewed.  Constitutional:      Appearance: Normal appearance.  HENT:     Head: Normocephalic and atraumatic.  Eyes:     General: No scleral icterus.    Conjunctiva/sclera: Conjunctivae normal.  Pulmonary:     Effort: Pulmonary effort is normal. No respiratory distress.  Musculoskeletal:     Comments: Full range of motion of all levels of the spine.  No midline tenderness.  Mild reproducible right lumbar paraspinal tenderness  Skin:    Findings: No rash.  Neurological:     Mental Status: She is alert.  Psychiatric:        Mood and Affect: Mood normal.     ED Results / Procedures / Treatments   Labs (all labs ordered are listed, but only abnormal results are displayed) Labs Reviewed  URINALYSIS, ROUTINE W REFLEX MICROSCOPIC  PREGNANCY, URINE    EKG None  Radiology No results found.  Procedures Procedures   Medications Ordered in ED Medications - No data to display  ED Course/ Medical Decision Making/ A&P                           Medical Decision Making Amount and/or Complexity of Data Reviewed Labs: ordered.  Risk Prescription drug management.   36 year old female presenting with low back pain.  Differential includes but is not limited to ovarian torsion, ectopic pregnancy, STD, nephrolithiasis, lumbar strain, epidural abscess, discitis/osteomyelitis.  Physical exam: Consistent with right lumbar strain.  No midline tenderness.  Treatment:  Given Robaxin, naproxen and lidocaine patch.  Work-up: Labs considered however patient appears to have musculoskeletal pain.  Labs will not be helpful.  Low suspicion renal etiology, no risk factors for infection.  MDM/disposition: Patient is without red flags of back pain.  Appears to be consistent with lumbar strain.  Likely from lifting heavy boxes at work.  Her job required her to come and get a work note for the next few weeks.  I told her I can give her 1 for 3 days but otherwise she needs to follow-up outpatient.  She is agreeable to the plan.  Sent naproxen, Robaxin and lidocaine patches to her pharmacy.  We discussed proper use.  She had no further questions and ambulated out of the department.  Final Clinical Impression(s) / ED Diagnoses Final diagnoses:  Acute right-sided low back pain without sciatica    Rx / DC Orders ED Discharge Orders          Ordered    lidocaine (LIDODERM) 5 %  Every 24 hours        03/03/22 2004    naproxen (NAPROSYN) 500 MG tablet  2 times daily        03/03/22 2004    methocarbamol (ROBAXIN) 500 MG tablet  2 times daily        03/03/22 2004           Results and diagnoses were explained to the patient. Return precautions discussed in full. Patient had no additional questions and expressed complete understanding.   This chart was dictated using voice recognition software.  Despite best efforts to proofread,  errors can occur which can change the documentation meaning.    Woodroe Chen 03/03/22 2033    Tegeler, Canary Brim, MD 03/03/22 [phone]

## 2022-03-03 NOTE — ED Triage Notes (Addendum)
Patient c/o intermittent lower back pain since Wednesday. Patient states the pain worsens when she lays down. She does report clear vaginal discharge. Denies any urinary symptoms. Patient reports she lifts a lot of boxes at work, and was told to come to the ER by her employer to get a work note.

## 2022-03-03 NOTE — Discharge Instructions (Signed)
Remember that lidocaine patches can be used for only 12 hours at a time.  You must have 12 hours patch free.  Methocarbamol is the muscle relaxant.  It may make you tired so do not drive or work on this.  Naproxen is an NSAID.  Do not take ibuprofen or aspirin at the same time.  You may continue to take Tylenol with this.  Take it with food because it may upset your stomach.  Heat packs will help decrease any swelling and pain to your back muscles.  Your work note is attached.  Return with any worsening symptoms, otherwise please get established with a primary care to reevaluate your symptoms.  It was a pleasure to meet you and I hope that you feel better!

## 2023-03-11 ENCOUNTER — Encounter: Payer: Self-pay | Admitting: Family Medicine

## 2023-03-11 ENCOUNTER — Other Ambulatory Visit (HOSPITAL_COMMUNITY)
Admission: RE | Admit: 2023-03-11 | Discharge: 2023-03-11 | Disposition: A | Discharge status: Home / Self Care | Payer: 59 | Source: Ambulatory Visit | Attending: Family Medicine | Admitting: Family Medicine

## 2023-03-11 ENCOUNTER — Ambulatory Visit (INDEPENDENT_AMBULATORY_CARE_PROVIDER_SITE_OTHER): Payer: 59 | Admitting: Family Medicine

## 2023-03-11 VITALS — BP 120/76 | HR 82 | Ht 65.0 in | Wt 177.9 lb

## 2023-03-11 DIAGNOSIS — Z1339 Encounter for screening examination for other mental health and behavioral disorders: Secondary | ICD-10-CM

## 2023-03-11 DIAGNOSIS — Z113 Encounter for screening for infections with a predominantly sexual mode of transmission: Secondary | ICD-10-CM | POA: Diagnosis not present

## 2023-03-11 DIAGNOSIS — N760 Acute vaginitis: Secondary | ICD-10-CM

## 2023-03-11 DIAGNOSIS — B9689 Other specified bacterial agents as the cause of diseases classified elsewhere: Secondary | ICD-10-CM

## 2023-03-11 DIAGNOSIS — A749 Chlamydial infection, unspecified: Secondary | ICD-10-CM

## 2023-03-11 DIAGNOSIS — Z124 Encounter for screening for malignant neoplasm of cervix: Secondary | ICD-10-CM

## 2023-03-11 DIAGNOSIS — R8290 Unspecified abnormal findings in urine: Secondary | ICD-10-CM

## 2023-03-11 DIAGNOSIS — Z01419 Encounter for gynecological examination (general) (routine) without abnormal findings: Secondary | ICD-10-CM | POA: Insufficient documentation

## 2023-03-11 DIAGNOSIS — F319 Bipolar disorder, unspecified: Secondary | ICD-10-CM | POA: Diagnosis not present

## 2023-03-11 DIAGNOSIS — Z1151 Encounter for screening for human papillomavirus (HPV): Secondary | ICD-10-CM | POA: Diagnosis not present

## 2023-03-11 DIAGNOSIS — J45909 Unspecified asthma, uncomplicated: Secondary | ICD-10-CM

## 2023-03-11 DIAGNOSIS — N3001 Acute cystitis with hematuria: Secondary | ICD-10-CM

## 2023-03-11 DIAGNOSIS — R051 Acute cough: Secondary | ICD-10-CM

## 2023-03-11 MED ORDER — GUAIFENESIN 100 MG/5ML PO LIQD: 5.0000 mL | ORAL | 0 refills | Status: DC | PRN

## 2023-03-11 MED ORDER — ALBUTEROL SULFATE HFA 108 (90 BASE) MCG/ACT IN AERS: 2.0000 | INHALATION_SPRAY | RESPIRATORY_TRACT | 0 refills | Status: DC | PRN

## 2023-03-11 NOTE — Progress Notes (Signed)
NGYN presents for AEX.  Last PAP unknown, Hx of abnormal PAP and Colpo Requesting PAP and STD testing  Pt c/o aching in the abd, foul smelling urine

## 2023-03-11 NOTE — Progress Notes (Signed)
ANNUAL EXAM Patient name: Karen Lester MRN [id number]  Date of birth: June 27, 1986 Chief Complaint:   Gynecologic Exam  History of Present Illness:   Karen Lester is a 37 y.o. [phone] female being seen today for a routine annual exam.  Current complaints:  - Malodorous urine -- ongoing x months, on and off, some pain in low back, but that goes away when she drinks plenty of water - STI screening -- would like to get swabs and blood work done today -- she recently had a negative HIV test -- three female partners in past year, has used protection as much as possible, but has had unprotected sex a number of times with one of her partners  Patient's last menstrual period was 03/08/2023.   The pregnancy intention screening data noted above was reviewed. S/p BTL  Last pap 2012. Results were:  abnormal, requiring colposcopy, reports had "scraping done" and was told to f/up in 6 months, but this has been her first time following up since colpo .  Last mammogram: n/a. Results were: N/A. Family h/o breast cancer: no Last colonoscopy: n/a. Results were: N/A. Family h/o colorectal cancer: yes in maternal grandfather, diagnosed in his 29s-70s     03/11/2023   10:00 AM  Depression screen PHQ 2/9  Decreased Interest 0  Down, Depressed, Hopeless 0  PHQ - 2 Score 0  Altered sleeping 0  Tired, decreased energy 0  Change in appetite 0  Feeling bad or failure about yourself  0  Trouble concentrating 0  Moving slowly or fidgety/restless 0  Suicidal thoughts 0  PHQ-9 Score 0        03/11/2023   10:02 AM  GAD 7 : Generalized Anxiety Score  Nervous, Anxious, on Edge 1  Control/stop worrying 0  Worry too much - different things 0  Trouble relaxing 0  Restless 0  Easily annoyed or irritable 2  Afraid - awful might happen 0  Total GAD 7 Score 3     Review of Systems:   Pertinent items are noted in HPI Denies any headaches, blurred vision, fatigue, shortness of breath, chest pain,  abdominal pain, abnormal vaginal discharge/itching/odor/irritation, problems with periods, bowel movements, urination, or intercourse unless otherwise stated above. Pertinent History Reviewed:  Reviewed past medical,surgical, social and family history.  Reviewed problem list, medications and allergies. Physical Assessment:   Vitals:   03/11/23 0951  BP: 120/76  Pulse: 82  Weight: 177 lb 14.4 oz (80.7 kg)  Height: 5\' 5"  (1.651 m)  Body mass index is 29.6 kg/m.        Physical Examination:   General appearance - well appearing, and in no distress  Mental status - alert, oriented to person, place, and time  Psych:  She has a normal mood and affect  Skin - warm and dry, normal color, no suspicious lesions noted  Chest - effort normal  Heart - normal rate  Neck:  midline trachea  Breasts - breasts appear normal, no suspicious masses, no skin or nipple changes or  axillary nodes  Abdomen - soft, nontender, nondistended, no masses or organomegaly  Pelvic - VULVA: normal appearing vulva with no masses, tenderness or lesions  VAGINA: normal appearing vagina with normal color and discharge, no lesions  CERVIX: normal appearing cervix without discharge or lesions, no CMT  Thin prep pap is done HR HPV cotesting  UTERUS: uterus is felt to be normal size, shape, consistency and nontender   ADNEXA: No adnexal masses or  tenderness noted.  Extremities:  No swelling or varicosities noted  Chaperone present for exam  No results found for this or any previous visit (from the past 24 hour(s)).  Assessment & Plan:  Cervical cancer screening Pap ordered today  history of abnormal pap requiring procedure -- she is unsure of exactly what was done  first pap today since her abnormal  Malodorous urine Dipstick w/o any clear evidence of infection Ucx ordered today  Encounter for screening examination for sexually transmitted infection Labs ordered  Bipolar 1 disorder (HCC) Referral to behavioral  health  Acute cough  Asthma, unspecified asthma severity, unspecified whether complicated, unspecified whether persistent Likely viral illness Robitussin and refill of Albuterol Rx'ed   Labs/procedures today: STI screening -- RPR, Hep B, Hep C; vaginal swabs, UA, Pap  Mammogram: @ 37yo, or sooner if problems Colonoscopy: @ 37yo, or sooner if problems  Orders Placed This Encounter  Procedures   Urine Culture   RPR   Hepatitis B Surface AntiGEN   Hepatitis C Antibody   Ambulatory referral to Integrated Behavioral Health    Meds:  Meds ordered this encounter  Medications   guaiFENesin (ROBITUSSIN) 100 MG/5ML liquid    Sig: Take 5 mLs by mouth every 4 (four) hours as needed for cough or to loosen phlegm.    Dispense:  120 mL    Refill:  0   albuterol (VENTOLIN HFA) 108 (90 Base) MCG/ACT inhaler    Sig: Inhale 2 puffs into the lungs every 2 (two) hours as needed for wheezing or shortness of breath (cough).    Dispense:  8 g    Refill:  0    Follow-up: Return in about 1 year (around 03/10/2024).  Sundra Aland, MD 03/11/2023 11:18 AM

## 2023-03-12 LAB — HEPATITIS C ANTIBODY: Hep C Virus Ab: NONREACTIVE

## 2023-03-12 LAB — HEPATITIS B SURFACE ANTIGEN: Hepatitis B Surface Ag: NEGATIVE

## 2023-03-12 LAB — RPR: RPR Ser Ql: NONREACTIVE

## 2023-03-12 MED ORDER — METRONIDAZOLE 500 MG PO TABS: 500.0000 mg | ORAL_TABLET | Freq: Two times a day (BID) | ORAL | 0 refills | Status: AC

## 2023-03-12 MED ORDER — DOXYCYCLINE HYCLATE 100 MG PO TABS: 100.0000 mg | ORAL_TABLET | Freq: Two times a day (BID) | ORAL | 0 refills | Status: DC

## 2023-03-12 NOTE — Progress Notes (Signed)
Chlamydia pos, gonorrhea pos, BV pos  RPR, Hep B, Hep C NR. Will route message to clinical team to get pt in for CTX. Sent Doxy and Flagyl to pharmacy.

## 2023-03-12 NOTE — Addendum Note (Signed)
Addended by: Sundra Aland on: 03/12/2023 03:53 PM   Modules accepted: Orders

## 2023-03-13 ENCOUNTER — Encounter (HOSPITAL_COMMUNITY): Payer: Self-pay

## 2023-03-13 ENCOUNTER — Ambulatory Visit: Payer: 59 | Admitting: *Deleted

## 2023-03-13 ENCOUNTER — Telehealth: Payer: Self-pay | Admitting: Family Medicine

## 2023-03-13 ENCOUNTER — Ambulatory Visit (HOSPITAL_COMMUNITY)
Admission: EM | Admit: 2023-03-13 | Discharge: 2023-03-13 | Disposition: A | Discharge status: Home / Self Care | Payer: 59 | Attending: Emergency Medicine | Admitting: Emergency Medicine

## 2023-03-13 VITALS — BP 106/78 | HR 81

## 2023-03-13 DIAGNOSIS — N760 Acute vaginitis: Secondary | ICD-10-CM | POA: Diagnosis present

## 2023-03-13 DIAGNOSIS — J029 Acute pharyngitis, unspecified: Secondary | ICD-10-CM | POA: Diagnosis present

## 2023-03-13 DIAGNOSIS — B9689 Other specified bacterial agents as the cause of diseases classified elsewhere: Secondary | ICD-10-CM

## 2023-03-13 DIAGNOSIS — A749 Chlamydial infection, unspecified: Secondary | ICD-10-CM | POA: Insufficient documentation

## 2023-03-13 DIAGNOSIS — A549 Gonococcal infection, unspecified: Secondary | ICD-10-CM

## 2023-03-13 LAB — POCT RAPID STREP A (OFFICE): Rapid Strep A Screen: NEGATIVE

## 2023-03-13 MED ORDER — FLUCONAZOLE 150 MG PO TABS: ORAL_TABLET | ORAL | 0 refills | Status: DC

## 2023-03-13 MED ORDER — METRONIDAZOLE 0.75 % VA GEL: 1.0000 | Freq: Every day | VAGINAL | 1 refills | Status: DC

## 2023-03-13 MED ORDER — CEFTRIAXONE SODIUM 500 MG IJ SOLR
500.0000 mg | Freq: Once | INTRAMUSCULAR | Status: AC
  Administered 2023-03-13: 500 mg via INTRAMUSCULAR

## 2023-03-13 NOTE — Discharge Instructions (Addendum)
Your strep testing was negative today in clinic, we are sending this off for culture and we will notify you if antibiotics are indicated.  Please continue taking the doxycycline and choose either the MetroGel or the oral Flagyl to complete to treat your bacterial vaginosis.  For sore throat you can gargle warm saline water, and alternate between Tylenol and ibuprofen for pain and inflammation.  You can consider cold things like popsicles to help soothe your throat as well.  I have sent in Diflucan to help prevent against vaginal yeast infections, take this on day 3 of antibiotics and then the last day of your antibiotics.  Chlamydia - doxycycline (oral antibiotics) BV - metrogel or oral Flagyl (choose one to complete) Gonorrhea - IM Rocephin   Return to clinic for new or urgent symptoms.

## 2023-03-13 NOTE — ED Provider Notes (Signed)
[facility]    CSN: [id number] Arrival date & time: 03/13/23  1148      History   Chief Complaint Chief Complaint  Patient presents with   Sore Throat    HPI Karen Lester is a 37 y.o. female.   Patient presents to clinic for sore throat for the past two days, it got much worse today. She recently kissed someone who tested positive for strep throat. She had tylenol this morning, it has not helped much. Pain with eating and drinking, tolerating fluids in clinic.   Also recently tested positive for bacterial vaginosis, gonorrhea and chlamydia.  She was confused with her medications as she received MetroGel, Flagyl, doxycycline, and IM Rocephin. She has taken one dose of oral Flagyl, this upset her stomach.       The history is provided by the patient and medical records.  Sore Throat Pertinent negatives include no chest pain and no abdominal pain.    Past Medical History:  Diagnosis Date   Abnormal Pap smear    had colpo   ADHD (attention deficit hyperactivity disorder)    Anxiety    Asthma    Bipolar 1 disorder (HCC)    Depression    doing fine, not currently on meds   Herpes    Urinary tract infection    Vaginal Pap smear, abnormal     Patient Active Problem List   Diagnosis Date Noted   Pregnancy 08/03/2013   Bipolar 1 disorder (HCC) 02/24/2012    Class: Chronic   ADHD (attention deficit hyperactivity disorder) 02/24/2012    Class: Chronic   Polysubstance abuse (HCC) 02/24/2012    Class: Chronic    Past Surgical History:  Procedure Laterality Date   COLPOSCOPY     INDUCED ABORTION     TUBAL LIGATION Bilateral 08/04/2013   Procedure: POST PARTUM TUBAL LIGATION;  Surgeon: Lesly Dukes, MD;  Location: [facility];  Service: Gynecology;  Laterality: Bilateral;    OB History     Gravida  6   Para  5   Term  5   Preterm      AB  1   Living  5      SAB      IAB  1   Ectopic      Multiple      Live Births  5             Home Medications    Prior to Admission medications   Medication Sig Start Date End Date Taking? Authorizing Provider  fluconazole (DIFLUCAN) 150 MG tablet Take 1 tablet on day 3 of antibiotics and another tablet on the last day of antibiotics to help prevent against a vaginal yeast infection. 03/13/23  Yes Rinaldo Ratel, [place] N, FNP  metroNIDAZOLE (FLAGYL) 500 MG tablet Take 1 tablet (500 mg total) by mouth 2 (two) times daily for 7 days. 03/12/23 03/19/23 Yes Sundra Aland, MD  albuterol (VENTOLIN HFA) 108 (90 Base) MCG/ACT inhaler Inhale 2 puffs into the lungs every 2 (two) hours as needed for wheezing or shortness of breath (cough). 03/11/23   Sundra Aland, MD  doxycycline (VIBRA-TABS) 100 MG tablet Take 1 tablet (100 mg total) by mouth 2 (two) times daily. 03/12/23   Sundra Aland, MD  guaiFENesin (ROBITUSSIN) 100 MG/5ML liquid Take 5 mLs by mouth every 4 (four) hours as needed for cough or to loosen phlegm. 03/11/23   Sundra Aland, MD  methylphenidate 36 MG PO CR tablet Take 36  mg by mouth daily.    [provider]  metroNIDAZOLE (METROGEL) 0.75 % vaginal gel Place 1 Applicatorful vaginally at bedtime. Apply one applicatorful to vagina at bedtime for 5 days 03/13/23   Adam Phenix, MD    Family History Family History  Problem Relation Age of Onset   Hypertension Mother    COPD Mother    Mental retardation Brother        down's syndrome   Asthma Son     Social History Social History   Tobacco Use   Smoking status: Every Day    Current packs/day: 0.00    Average packs/day: 0.3 packs/day for 16.0 years (4.0 ttl pk-yrs)    Types: Cigarettes    Start date: 06/13/1997    Last attempt to quit: 06/13/2013    Years since quitting: 9.7   Smokeless tobacco: Never  Vaping Use   Vaping status: Never Used  Substance Use Topics   Alcohol use: Yes    Comment: [employer].   Drug use: Yes    Types: Marijuana     Allergies   Patient has no known allergies.   Review of  Systems Review of Systems  Constitutional:  Negative for fever.  HENT:  Positive for sore throat. Negative for congestion.   Respiratory:  Negative for cough.   Cardiovascular:  Negative for chest pain.  Gastrointestinal:  Negative for abdominal pain.     Physical Exam Triage Vital Signs ED Triage Vitals  Encounter Vitals Group     BP 03/13/23 1250 107/62     Systolic BP Percentile --      Diastolic BP Percentile --      Pulse Rate 03/13/23 1250 81     Resp 03/13/23 1250 16     Temp 03/13/23 1250 98.1 F (36.7 C)     Temp Source 03/13/23 1250 Oral     SpO2 03/13/23 1250 97 %     Weight 03/13/23 1250 177 lb 14.6 oz (80.7 kg)     Height 03/13/23 1250 5\' 5"  (1.651 m)     Head Circumference --      Peak Flow --      Pain Score 03/13/23 1249 9     Pain Loc --      Pain Education --      Exclude from Growth Chart --    No data found.  Updated Vital Signs BP 107/62 (BP Location: Right Arm)   Pulse 81   Temp 98.1 F (36.7 C) (Oral)   Resp 16   Ht 5\' 5"  (1.651 m)   Wt 177 lb 14.6 oz (80.7 kg)   LMP 03/08/2023 (Exact Date)   SpO2 97%   Breastfeeding No   BMI 29.61 kg/m   Visual Acuity Right Eye Distance:   Left Eye Distance:   Bilateral Distance:    Right Eye Near:   Left Eye Near:    Bilateral Near:     Physical Exam Vitals and nursing note reviewed.  Constitutional:      Appearance: Normal appearance. She is well-developed.  HENT:     Head: Normocephalic and atraumatic.     Right Ear: External ear normal.     Left Ear: External ear normal.     Nose: No congestion or rhinorrhea.     Mouth/Throat:     Mouth: Mucous membranes are moist.     Pharynx: Uvula midline.     Tonsils: Tonsillar exudate present. No tonsillar abscesses. 3+ on the right. 3+  on the left.  Cardiovascular:     Rate and Rhythm: Normal rate.  Pulmonary:     Effort: Pulmonary effort is normal. No respiratory distress.  Musculoskeletal:     Cervical back: Normal range of motion.   Skin:    General: Skin is warm and dry.  Neurological:     General: No focal deficit present.     Mental Status: She is alert and oriented to person, place, and time.  Psychiatric:        Mood and Affect: Mood normal.        Behavior: Behavior normal.      UC Treatments / Results  Labs (all labs ordered are listed, but only abnormal results are displayed) Labs Reviewed  CULTURE, GROUP A STREP [facility])  POCT RAPID STREP A (OFFICE)    EKG   Radiology No results found.  Procedures Procedures (including critical care time)  Medications Ordered in UC Medications - No data to display  Initial Impression / Assessment and Plan / UC Course  I have reviewed the triage vital signs and the nursing notes.  Pertinent labs & imaging results that were available during my care of the patient were reviewed by me and considered in my medical decision making (see chart for details).  Vitals and triage reviewed, patient is hemodynamically stable.  Tonsils with exudate and bilateral edema, rapid strep negative. Will send for culture and contact if antibiotics are indicated.  Tolerating oral food and fluids. Discussed in detail what antibiotics are indicated for which infection, sent in Diflucan to prevent against vaginal yeast infection.  Symptom medic relief discussed for pharyngitis.  Plan of care, follow-up care and return precautions given, no questions at this time.     Final Clinical Impressions(s) / UC Diagnoses   Final diagnoses:  Pharyngitis, unspecified etiology  Bacterial vaginosis  Chlamydia     Discharge Instructions      Your strep testing was negative today in clinic, we are sending this off for culture and we will notify you if antibiotics are indicated.  Please continue taking the doxycycline and choose either the MetroGel or the oral Flagyl to complete to treat your bacterial vaginosis.  For sore throat you can gargle warm saline water, and alternate between Tylenol  and ibuprofen for pain and inflammation.  You can consider cold things like popsicles to help soothe your throat as well.  I have sent in Diflucan to help prevent against vaginal yeast infections, take this on day 3 of antibiotics and then the last day of your antibiotics.  Chlamydia - doxycycline (oral antibiotics) BV - metrogel or oral Flagyl (choose one to complete) Gonorrhea - IM Rocephin   Return to clinic for new or urgent symptoms.     ED Prescriptions     Medication Sig Dispense Auth. Provider   fluconazole (DIFLUCAN) 150 MG tablet Take 1 tablet on day 3 of antibiotics and another tablet on the last day of antibiotics to help prevent against a vaginal yeast infection. 2 tablet Kyndall Chaplin, [place] N, FNP      PDMP not reviewed this encounter.   Kai Calico, [place] N, [place] 03/13/23 1340

## 2023-03-13 NOTE — Telephone Encounter (Signed)
Telephone call to patient, notified her of positive results.    Patient scheduled to come in today for treatment of her GC.   Rx routed to pharmacy by provider for her CT and BV.   [facility].

## 2023-03-13 NOTE — ED Triage Notes (Signed)
Sore Throat. Patient states someone around her tested positive. Onset of symptoms 2 days ago and worse last night.

## 2023-03-13 NOTE — Progress Notes (Signed)
Karen Lester presents for IM Rocephin for TX of Gonorrhea on 03/11/23 vaginal swab. Pt positive for chlamydia and BV as well. RX previously sent by provider. Pt requests metrogel in lieu of po metronidazole. RX sent per protocol. Reviewed standard STI recommendations for TX of partner(s), abstinence period, and TOC. Pt verbalized understanding. Rocephin injection RUO glut. Tolerated well.

## 2023-03-14 LAB — CULTURE, GROUP A STREP (THRC)

## 2023-03-15 ENCOUNTER — Telehealth (HOSPITAL_COMMUNITY): Payer: Self-pay | Admitting: Emergency Medicine

## 2023-03-15 LAB — URINE CULTURE

## 2023-03-15 MED ORDER — FLUCONAZOLE 150 MG PO TABS: ORAL_TABLET | ORAL | 0 refills | Status: DC

## 2023-03-15 MED ORDER — AMOXICILLIN 500 MG PO CAPS: 1000.0000 mg | ORAL_CAPSULE | Freq: Every day | ORAL | 0 refills | Status: AC

## 2023-03-15 NOTE — Telephone Encounter (Signed)
Notified of positive strep results via phone, started on amoxicillin, sent in diflucan as well.

## 2023-03-17 MED ORDER — CEPHALEXIN 500 MG PO CAPS: 500.0000 mg | ORAL_CAPSULE | Freq: Four times a day (QID) | ORAL | 0 refills | Status: AC

## 2023-03-17 NOTE — Addendum Note (Signed)
Addended by: Sundra Aland on: 03/17/2023 04:59 PM   Modules accepted: Orders

## 2023-03-17 NOTE — Progress Notes (Signed)
Ucx pos for E.coli. Will send abx.

## 2023-03-19 LAB — CYTOLOGY - PAP
Comment: NEGATIVE
Diagnosis: UNDETERMINED — AB
High risk HPV: NEGATIVE

## 2023-03-19 LAB — CERVICOVAGINAL ANCILLARY ONLY
Bacterial Vaginitis (gardnerella): POSITIVE — AB
Candida Glabrata: NEGATIVE
Candida Vaginitis: NEGATIVE
Chlamydia: POSITIVE — AB
Comment: NEGATIVE
Comment: NEGATIVE
Comment: NEGATIVE
Comment: NEGATIVE
Comment: NEGATIVE
Comment: NORMAL
Neisseria Gonorrhea: POSITIVE — AB
Trichomonas: NEGATIVE

## 2023-03-24 NOTE — Progress Notes (Signed)
ASCUS on pap, neg hrHPV. Per [facility] for rarely screened individuals based on patient's age group and results, repeat pap in 3 yrs. Will route to clinical team to let pt know about results.

## 2023-04-13 ENCOUNTER — Ambulatory Visit (HOSPITAL_COMMUNITY)
Admission: EM | Admit: 2023-04-13 | Discharge: 2023-04-13 | Disposition: A | Discharge status: Home / Self Care | Payer: 59 | Attending: Family Medicine | Admitting: Family Medicine

## 2023-04-13 ENCOUNTER — Encounter (HOSPITAL_COMMUNITY): Payer: Self-pay

## 2023-04-13 DIAGNOSIS — Z202 Contact with and (suspected) exposure to infections with a predominantly sexual mode of transmission: Secondary | ICD-10-CM | POA: Insufficient documentation

## 2023-04-13 DIAGNOSIS — N760 Acute vaginitis: Secondary | ICD-10-CM | POA: Insufficient documentation

## 2023-04-13 MED ORDER — METRONIDAZOLE 0.75 % VA GEL: 1.0000 | Freq: Two times a day (BID) | VAGINAL | 0 refills | Status: DC

## 2023-04-13 MED ORDER — CEFTRIAXONE SODIUM 500 MG IJ SOLR
500.0000 mg | INTRAMUSCULAR | Status: DC
  Administered 2023-04-13: 500 mg via INTRAMUSCULAR

## 2023-04-13 MED ORDER — FLUCONAZOLE 150 MG PO TABS: 150.0000 mg | ORAL_TABLET | ORAL | 0 refills | Status: AC

## 2023-04-13 MED ORDER — DOXYCYCLINE HYCLATE 100 MG PO CAPS: 100.0000 mg | ORAL_CAPSULE | Freq: Two times a day (BID) | ORAL | 0 refills | Status: AC

## 2023-04-13 MED ORDER — CEFTRIAXONE SODIUM 500 MG IJ SOLR
INTRAMUSCULAR | Status: AC
  Filled 2023-04-13: qty 500

## 2023-04-13 MED ORDER — LIDOCAINE HCL (PF) 1 % IJ SOLN
INTRAMUSCULAR | Status: AC
  Filled 2023-04-13: qty 2

## 2023-04-13 NOTE — Discharge Instructions (Signed)
Staff will notify you if there is anything positive on the swab.  You have been given a shot of ceftriaxone 500 mg; this is for potential gonorrhea  Take doxycycline 100 mg --1 capsule 2 times daily for 7 days; this is for potential chlamydia  Take fluconazole 150 mg--1 tablet every 3 days for 2 doses; this is to treat or prevent yeast infection  MetroGel--1 applicatorful per vagina 2 times daily for 5 days; this is for potential BV.

## 2023-04-13 NOTE — ED Triage Notes (Signed)
Vaginal discharge. Patient reports partner is cheating on her.

## 2023-04-13 NOTE — ED Provider Notes (Signed)
[facility]    CSN: [id number] Arrival date & time: 04/13/23  1236      History   Chief Complaint Chief Complaint  Patient presents with   Exposure to STD    HPI Karen Lester is a 37 y.o. female.    Exposure to STD  Here for vaginal discharge is been going on for 3 to 4 days.  She is maybe having some occasional abdominal cramping.  No dysuria or fever or vomiting.  Last menstrual cycle began September 25.  She is concerned about exposure to sexually transmitted diseases with her new sexual partner(they have been together about 2 or 3 weeks.)  On August 28 she was seen by her OB/GYN and tested positive for chlamydia, gonorrhea, and BV.  She was treated with Ceftin, doxycycline, and MetroGel.  She prefers not to take Flagyl tablets.  She did have negative testing for syphilis and HIV at the end of August also.  No allergies to medications   Past Medical History:  Diagnosis Date   Abnormal Pap smear    had colpo   ADHD (attention deficit hyperactivity disorder)    Anxiety    Asthma    Bipolar 1 disorder (HCC)    Depression    doing fine, not currently on meds   Herpes    Urinary tract infection    Vaginal Pap smear, abnormal     Patient Active Problem List   Diagnosis Date Noted   Pregnancy 08/03/2013   Bipolar 1 disorder (HCC) 02/24/2012    Class: Chronic   ADHD (attention deficit hyperactivity disorder) 02/24/2012    Class: Chronic   Polysubstance abuse (HCC) 02/24/2012    Class: Chronic    Past Surgical History:  Procedure Laterality Date   COLPOSCOPY     INDUCED ABORTION     TUBAL LIGATION Bilateral 08/04/2013   Procedure: POST PARTUM TUBAL LIGATION;  Surgeon: Lesly Dukes, MD;  Location: [facility];  Service: Gynecology;  Laterality: Bilateral;    OB History     Gravida  6   Para  5   Term  5   Preterm      AB  1   Living  5      SAB      IAB  1   Ectopic      Multiple      Live Births  5             Home Medications    Prior to Admission medications   Medication Sig Start Date End Date Taking? Authorizing Provider  doxycycline (VIBRAMYCIN) 100 MG capsule Take 1 capsule (100 mg total) by mouth 2 (two) times daily for 7 days. 04/13/23 04/20/23 Yes Zenia Resides, MD  fluconazole (DIFLUCAN) 150 MG tablet Take 1 tablet (150 mg total) by mouth every 3 (three) days for 2 doses. 04/13/23 04/17/23 Yes Charee Tumblin, Janace Aris, MD  metroNIDAZOLE (METROGEL) 0.75 % vaginal gel Place 1 Applicatorful vaginally 2 (two) times daily. 04/13/23  Yes Zenia Resides, MD  albuterol (VENTOLIN HFA) 108 (90 Base) MCG/ACT inhaler Inhale 2 puffs into the lungs every 2 (two) hours as needed for wheezing or shortness of breath (cough). 03/11/23   Sundra Aland, MD  methylphenidate 36 MG PO CR tablet Take 36 mg by mouth daily.    [provider]    Family History Family History  Problem Relation Age of Onset   Hypertension Mother    COPD Mother  Mental retardation Brother        down's syndrome   Asthma Son     Social History Social History   Tobacco Use   Smoking status: Every Day    Current packs/day: 0.00    Average packs/day: 0.3 packs/day for 16.0 years (4.0 ttl pk-yrs)    Types: Cigarettes    Start date: 06/13/1997    Last attempt to quit: 06/13/2013    Years since quitting: 9.8   Smokeless tobacco: Never  Vaping Use   Vaping status: Never Used  Substance Use Topics   Alcohol use: Yes    Comment: [employer].   Drug use: Yes    Types: Marijuana     Allergies   Patient has no known allergies.   Review of Systems Review of Systems   Physical Exam Triage Vital Signs ED Triage Vitals [04/13/23 1324]  Encounter Vitals Group     BP 117/74     Systolic BP Percentile      Diastolic BP Percentile      Pulse Rate (!) 114     Resp 16     Temp 98.5 F (36.9 C)     Temp Source Oral     SpO2 98 %     Weight      Height      Head Circumference      Peak Flow      Pain  Score      Pain Loc      Pain Education      Exclude from Growth Chart    No data found.  Updated Vital Signs BP 117/74 (BP Location: Left Arm)   Pulse (!) 114   Temp 98.5 F (36.9 C) (Oral)   Resp 16   LMP 04/08/2023   SpO2 98%   Visual Acuity Right Eye Distance:   Left Eye Distance:   Bilateral Distance:    Right Eye Near:   Left Eye Near:    Bilateral Near:     Physical Exam Vitals reviewed.  Constitutional:      General: She is not in acute distress.    Appearance: She is not ill-appearing, toxic-appearing or diaphoretic.  HENT:     Mouth/Throat:     Mouth: Mucous membranes are moist.  Eyes:     Extraocular Movements: Extraocular movements intact.     Pupils: Pupils are equal, round, and reactive to light.  Cardiovascular:     Rate and Rhythm: Normal rate and regular rhythm.     Heart sounds: No murmur heard. Pulmonary:     Effort: Pulmonary effort is normal.     Breath sounds: Normal breath sounds.  Abdominal:     Palpations: Abdomen is soft.     Tenderness: There is no abdominal tenderness.  Musculoskeletal:     Cervical back: Neck supple.  Lymphadenopathy:     Cervical: No cervical adenopathy.  Skin:    Coloration: Skin is not jaundiced or pale.  Neurological:     General: No focal deficit present.     Mental Status: She is alert and oriented to person, place, and time.  Psychiatric:        Behavior: Behavior normal.      UC Treatments / Results  Labs (all labs ordered are listed, but only abnormal results are displayed) Labs Reviewed  CERVICOVAGINAL ANCILLARY ONLY    EKG   Radiology No results found.  Procedures Procedures (including critical care time)  Medications Ordered in UC Medications  cefTRIAXone (  ROCEPHIN) injection 500 mg (has no administration in time range)    Initial Impression / Assessment and Plan / UC Course  I have reviewed the triage vital signs and the nursing notes.  Pertinent labs & imaging results that  were available during my care of the patient were reviewed by me and considered in my medical decision making (see chart for details).       Vaginal self swab is done, and we will notify of any positives on that and treat per protocol. She declined my offer of repeat HIV and RPR testing.  She wanted to be retreated for everything she had tested positive for recently.  Injection of Rocephin is given here and doxycycline, Diflucan, and MetroGel sent in.   Final Clinical Impressions(s) / UC Diagnoses   Final diagnoses:  Acute vaginitis  Exposure to STD     Discharge Instructions      Staff will notify you if there is anything positive on the swab.  You have been given a shot of ceftriaxone 500 mg; this is for potential gonorrhea  Take doxycycline 100 mg --1 capsule 2 times daily for 7 days; this is for potential chlamydia  Take fluconazole 150 mg--1 tablet every 3 days for 2 doses; this is to treat or prevent yeast infection  MetroGel--1 applicatorful per vagina 2 times daily for 5 days; this is for potential BV.     ED Prescriptions     Medication Sig Dispense Auth. Provider   doxycycline (VIBRAMYCIN) 100 MG capsule Take 1 capsule (100 mg total) by mouth 2 (two) times daily for 7 days. 14 capsule Zenia Resides, MD   fluconazole (DIFLUCAN) 150 MG tablet Take 1 tablet (150 mg total) by mouth every 3 (three) days for 2 doses. 2 tablet Zenia Resides, MD   metroNIDAZOLE (METROGEL) 0.75 % vaginal gel Place 1 Applicatorful vaginally 2 (two) times daily. 70 g Zenia Resides, MD      PDMP not reviewed this encounter.   Zenia Resides, MD 04/13/23 [phone]

## 2023-04-14 LAB — CERVICOVAGINAL ANCILLARY ONLY
Bacterial Vaginitis (gardnerella): POSITIVE — AB
Candida Glabrata: NEGATIVE
Candida Vaginitis: NEGATIVE
Chlamydia: NEGATIVE
Comment: NEGATIVE
Comment: NEGATIVE
Comment: NEGATIVE
Comment: NEGATIVE
Comment: NEGATIVE
Comment: NORMAL
Neisseria Gonorrhea: NEGATIVE
Trichomonas: NEGATIVE

## 2023-04-16 ENCOUNTER — Telehealth (HOSPITAL_COMMUNITY): Payer: Self-pay | Admitting: Emergency Medicine

## 2024-01-18 ENCOUNTER — Ambulatory Visit

## 2024-01-26 ENCOUNTER — Encounter (HOSPITAL_COMMUNITY): Payer: Self-pay

## 2024-01-26 ENCOUNTER — Ambulatory Visit (HOSPITAL_COMMUNITY)
Admission: RE | Admit: 2024-01-26 | Discharge: 2024-01-26 | Disposition: A | Discharge status: Home / Self Care | Source: Ambulatory Visit | Attending: Family Medicine | Admitting: Family Medicine

## 2024-01-26 VITALS — BP 144/69 | HR 65 | Temp 98.9°F | Resp 16

## 2024-01-26 DIAGNOSIS — Z202 Contact with and (suspected) exposure to infections with a predominantly sexual mode of transmission: Secondary | ICD-10-CM | POA: Insufficient documentation

## 2024-01-26 DIAGNOSIS — J45909 Unspecified asthma, uncomplicated: Secondary | ICD-10-CM | POA: Diagnosis present

## 2024-01-26 DIAGNOSIS — B9689 Other specified bacterial agents as the cause of diseases classified elsewhere: Secondary | ICD-10-CM | POA: Diagnosis present

## 2024-01-26 DIAGNOSIS — Z7721 Contact with and (suspected) exposure to potentially hazardous body fluids: Secondary | ICD-10-CM | POA: Diagnosis not present

## 2024-01-26 DIAGNOSIS — L080 Pyoderma: Secondary | ICD-10-CM | POA: Diagnosis present

## 2024-01-26 DIAGNOSIS — N760 Acute vaginitis: Secondary | ICD-10-CM | POA: Insufficient documentation

## 2024-01-26 DIAGNOSIS — J4520 Mild intermittent asthma, uncomplicated: Secondary | ICD-10-CM | POA: Diagnosis present

## 2024-01-26 LAB — POCT URINALYSIS DIP (MANUAL ENTRY)
Bilirubin, UA: NEGATIVE
Blood, UA: NEGATIVE
Glucose, UA: NEGATIVE mg/dL
Ketones, POC UA: NEGATIVE mg/dL
Leukocytes, UA: NEGATIVE
Nitrite, UA: NEGATIVE
Protein Ur, POC: NEGATIVE mg/dL
Spec Grav, UA: 1.025 (ref 1.010–1.025)
Urobilinogen, UA: 1 U/dL
pH, UA: 6 (ref 5.0–8.0)

## 2024-01-26 MED ORDER — MUPIROCIN 2 % EX OINT: 1.0000 | TOPICAL_OINTMENT | Freq: Two times a day (BID) | CUTANEOUS | 0 refills | Status: AC

## 2024-01-26 MED ORDER — LIDOCAINE HCL (PF) 1 % IJ SOLN
INTRAMUSCULAR | Status: AC
  Filled 2024-01-26: qty 2

## 2024-01-26 MED ORDER — ALBUTEROL SULFATE HFA 108 (90 BASE) MCG/ACT IN AERS: 2.0000 | INHALATION_SPRAY | RESPIRATORY_TRACT | 0 refills | Status: AC | PRN

## 2024-01-26 MED ORDER — AZITHROMYCIN 250 MG PO TABS
ORAL_TABLET | ORAL | 0 refills | Status: AC
Start: 2024-01-26 — End: ?

## 2024-01-26 MED ORDER — CEFTRIAXONE SODIUM 500 MG IJ SOLR
500.0000 mg | INTRAMUSCULAR | Status: DC
  Administered 2024-01-26: 500 mg via INTRAMUSCULAR

## 2024-01-26 MED ORDER — METRONIDAZOLE 0.75 % VA GEL: 1.0000 | Freq: Two times a day (BID) | VAGINAL | 0 refills | Status: AC

## 2024-01-26 MED ORDER — CEFTRIAXONE SODIUM 500 MG IJ SOLR
INTRAMUSCULAR | Status: AC
Start: 2024-01-26 — End: 2024-01-26
  Filled 2024-01-26: qty 500

## 2024-01-26 NOTE — ED Provider Notes (Addendum)
 [facility]    CSN: [id number] Arrival date & time: 01/26/24  1526      History   Chief Complaint Chief Complaint  Patient presents with   SEXUALLY TRANSMITTED DISEASE   Vaginal Discharge    HPI Karen Lester is a 38 y.o. female.   Patient presents today with complaint of vaginal discharge with odor since 01/04/2024 or earlier.  She reports that she believes is bacterial vaginosis and would like MetroGel  instead of metronidazole  pills.  She also expressed that she wants STI testing for general health and she wants treatment for gonorrhea and chlamydia.  I explained that we do not provide treatment for gonorrhea and chlamydia unless she has a known exposure or a positive STI swab.  She then reported that her boyfriend told her a few days ago that he has both gonorrhea and chlamydia and she needs to get treated and tested.  She did not set that initially.  I advised her about antibiotic resistant gonorrhea and chlamydia but she insisted that she had an exposure and needed treatment.  Due to her vaginal discharge, patient wants to have urinalysis checked to make sure she does not have a UTI.  Patient reports she has chronic asthma with intermittent bronchitis.   Vaginal Discharge Associated symptoms: no abdominal pain, no fever, no nausea and no vomiting     Past Medical History:  Diagnosis Date   Abnormal Pap smear    had colpo   ADHD (attention deficit hyperactivity disorder)    Anxiety    Asthma    Bipolar 1 disorder (HCC)    Depression    doing fine, not currently on meds   Herpes    Urinary tract infection    Vaginal Pap smear, abnormal     Patient Active Problem List   Diagnosis Date Noted   Pregnancy 08/03/2013   Bipolar 1 disorder (HCC) 02/24/2012    Class: Chronic   ADHD (attention deficit hyperactivity disorder) 02/24/2012    Class: Chronic   Polysubstance abuse (HCC) 02/24/2012    Class: Chronic    Past Surgical History:  Procedure  Laterality Date   COLPOSCOPY     INDUCED ABORTION     TUBAL LIGATION Bilateral 08/04/2013   Procedure: POST PARTUM TUBAL LIGATION;  Surgeon: Burnard VEAR Pate, MD;  Location: [facility];  Service: Gynecology;  Laterality: Bilateral;    OB History     Gravida  6   Para  5   Term  5   Preterm      AB  1   Living  5      SAB      IAB  1   Ectopic      Multiple      Live Births  5            Home Medications    Prior to Admission medications   Medication Sig Start Date End Date Taking? Authorizing Provider  azithromycin  (ZITHROMAX  Z-PAK) 250 MG tablet Take 4 pills or 1000 mg now as a one-time dose. 01/26/24  Yes Ival Domino, FNP  mupirocin  ointment (BACTROBAN ) 2 % Apply 1 Application topically 2 (two) times daily. 01/26/24  Yes Ival Domino, FNP  albuterol  (VENTOLIN  HFA) 108 (90 Base) MCG/ACT inhaler Inhale 2 puffs into the lungs every 2 (two) hours as needed for wheezing or shortness of breath (cough). 01/26/24   Ival Domino, FNP  methylphenidate 36 MG PO CR tablet Take 36 mg by mouth daily.  [provider]  metroNIDAZOLE  (METROGEL ) 0.75 % vaginal gel Place 1 Applicatorful vaginally 2 (two) times daily. 01/26/24   Ival Domino, FNP    Family History Family History  Problem Relation Age of Onset   Hypertension Mother    COPD Mother    Mental retardation Brother        down's syndrome   Asthma Son     Social History Social History   Tobacco Use   Smoking status: Every Day    Current packs/day: 0.00    Average packs/day: 0.3 packs/day for 16.0 years (4.0 ttl pk-yrs)    Types: Cigarettes    Start date: 06/13/1997    Last attempt to quit: 06/13/2013    Years since quitting: 10.6   Smokeless tobacco: Never  Vaping Use   Vaping status: Never Used  Substance Use Topics   Alcohol use: Yes    Comment: [employer].   Drug use: Yes    Types: Marijuana     Allergies   Patient has no known allergies.   Review of Systems Review of Systems   Constitutional:  Negative for fever.  Respiratory:  Negative for cough.   Cardiovascular:  Negative for chest pain.  Gastrointestinal:  Negative for abdominal pain, constipation, diarrhea, nausea and vomiting.  Genitourinary:  Positive for vaginal discharge.  Musculoskeletal:  Negative for arthralgias and back pain.  Skin:  Negative for color change and rash.  Neurological:  Negative for syncope.  All other systems reviewed and are negative.    Physical Exam Triage Vital Signs ED Triage Vitals  Encounter Vitals Group     BP 01/26/24 1551 (!) 144/69     Girls Systolic BP Percentile --      Girls Diastolic BP Percentile --      Boys Systolic BP Percentile --      Boys Diastolic BP Percentile --      Pulse Rate 01/26/24 1551 65     Resp 01/26/24 1551 16     Temp 01/26/24 1551 98.9 F (37.2 C)     Temp Source 01/26/24 1551 Oral     SpO2 01/26/24 1551 96 %     Weight --      Height --      Head Circumference --      Peak Flow --      Pain Score 01/26/24 1553 0     Pain Loc --      Pain Education --      Exclude from Growth Chart --    No data found.  Updated Vital Signs BP (!) 144/69 (BP Location: Right Arm)   Pulse 65   Temp 98.9 F (37.2 C) (Oral)   Resp 16   LMP 01/25/2024 (Exact Date)   SpO2 96%   Visual Acuity Right Eye Distance:   Left Eye Distance:   Bilateral Distance:    Right Eye Near:   Left Eye Near:    Bilateral Near:     Physical Exam Vitals and nursing note reviewed.  Constitutional:      General: She is not in acute distress.    Appearance: She is well-developed. She is not ill-appearing or toxic-appearing.  HENT:     Head: Normocephalic and atraumatic.     Right Ear: External ear normal.     Left Ear: External ear normal.     Nose: Nose normal.     Mouth/Throat:     Lips: Pink.     Mouth: Mucous membranes are moist.  Eyes:     Conjunctiva/sclera: Conjunctivae normal.     Pupils: Pupils are equal, round, and reactive to light.   Cardiovascular:     Rate and Rhythm: Normal rate and regular rhythm.     Heart sounds: S1 normal and S2 normal. No murmur heard. Pulmonary:     Effort: Pulmonary effort is normal. No respiratory distress.     Breath sounds: Normal breath sounds. No decreased breath sounds, wheezing, rhonchi or rales.  Genitourinary:    Comments: Declined exam.  Patient self collected a swab. Musculoskeletal:        General: No swelling.  Skin:    General: Skin is warm and dry.     Capillary Refill: Capillary refill takes less than 2 seconds.     Findings: Rash (Small cluster of crusted pustules on both buttocks.) present.  Neurological:     Mental Status: She is alert and oriented to person, place, and time.  Psychiatric:        Mood and Affect: Mood normal.      UC Treatments / Results  Labs (all labs ordered are listed, but only abnormal results are displayed) Labs Reviewed  POCT URINALYSIS DIP (MANUAL ENTRY)  CERVICOVAGINAL ANCILLARY ONLY    EKG   Radiology No results found.  Procedures Procedures (including critical care time)  Medications Ordered in UC Medications  cefTRIAXone  (ROCEPHIN ) injection 500 mg (500 mg Intramuscular Given 01/26/24 1619)    Initial Impression / Assessment and Plan / UC Course  I have reviewed the triage vital signs and the nursing notes.  Pertinent labs & imaging results that were available during my care of the patient were reviewed by me and considered in my medical decision making (see chart for details).  Plan of Care: Vaginitis and exposure to STIs: STI swab collected.  Ceftriaxone  500 mg injection now.  Azithromycin  250 mg, 4 pills now.  STI swab will be available on the portal and patient will only get a call if she needs new or different therapy since she has had full treatment for gonorrhea and chlamydia.  Urinalysis was normal.  Bacterial vaginitis: Provided metronidazole  gel for use 1 application, twice daily for 7 days.  Encouraged boric  acid suppositories or RepHresh pills or suppositories for vaginal pH.  Both of these could be used to reduce recurrence of BV.  Pustules on buttocks: Use mupirocin  ointment as needed.  Intermittent asthma: Refilled albuterol  inhaler.  Follow-up if symptoms do not improve, worsen or new symptoms occur.  I reviewed the plan of care with the patient and/or the patient's guardian.  The patient and/or guardian had time to ask questions and acknowledged that the questions were answered.  I provided instruction on symptoms or reasons to return here or to go to an ER, if symptoms/condition did not improve, worsened or if new symptoms occurred.  Final Clinical Impressions(s) / UC Diagnoses   Final diagnoses:  Exposure to potentially hazardous body fluids  Vaginitis and vulvovaginitis  Exposure to gonorrhea  Exposure to chlamydia  BV (bacterial vaginosis)  Pustular rash  Mild intermittent asthma without complication     Discharge Instructions      Exposure to gonorrhea and chlamydia: Ceftriaxone  500 mg injection now.  Azithromycin  250 mg, 4 pills now (total dose of 1000 mg once).  Encourage safe sex with use of condoms.  STI swab collected.  Will adjust the plan of care, if needed once the swab results.  The swab results will be available on the portal.  You will only be called if there are results that require additional treatment since you are being treated for gonorrhea, chlamydia, bacterial vaginosis today.  Bacterial vaginosis: Refilled metronidazole  gel.  Use as directed on the package.  Pustular rash on buttocks: Use mupirocin  ointment topically twice daily if needed.  Asthma: Lungs are clear today and oxygen saturation is great.  Refilled her albuterol  inhaler for use as needed.  Follow-up if symptoms do not improve, worsen or new symptoms occur.     ED Prescriptions     Medication Sig Dispense Auth. Provider   metroNIDAZOLE  (METROGEL ) 0.75 % vaginal gel Place 1 Applicatorful  vaginally 2 (two) times daily. 70 g Ival Domino, FNP   azithromycin  (ZITHROMAX  Z-PAK) 250 MG tablet Take 4 pills or 1000 mg now as a one-time dose. 4 tablet Charlesa Ehle, FNP   mupirocin  ointment (BACTROBAN ) 2 % Apply 1 Application topically 2 (two) times daily. 22 g Ival Domino, FNP   albuterol  (VENTOLIN  HFA) 108 (90 Base) MCG/ACT inhaler Inhale 2 puffs into the lungs every 2 (two) hours as needed for wheezing or shortness of breath (cough). 8 g Ival Domino, FNP      PDMP not reviewed this encounter.   Ival Domino, FNP 01/26/24 1700    Ival Domino, FNP 01/26/24 1701

## 2024-01-26 NOTE — Discharge Instructions (Addendum)
 Exposure to gonorrhea and chlamydia: Ceftriaxone  500 mg injection now.  Azithromycin  250 mg, 4 pills now (total dose of 1000 mg once).  Encourage safe sex with use of condoms.  STI swab collected.  Will adjust the plan of care, if needed once the swab results.  The swab results will be available on the portal.  You will only be called if there are results that require additional treatment since you are being treated for gonorrhea, chlamydia, bacterial vaginosis today.  Bacterial vaginosis: Refilled metronidazole  gel.  Use as directed on the package.  Pustular rash on buttocks: Use mupirocin  ointment topically twice daily if needed.  Asthma: Lungs are clear today and oxygen saturation is great.  Refilled her albuterol  inhaler for use as needed.  Follow-up if symptoms do not improve, worsen or new symptoms occur.

## 2024-01-26 NOTE — ED Triage Notes (Signed)
 Patient here today with c/o vaginal discharge and odor X 2-3 weeks. Patient states that she has a h/o BV and would like a prescription for the gel.

## 2024-01-27 LAB — CERVICOVAGINAL ANCILLARY ONLY
Bacterial Vaginitis (gardnerella): POSITIVE — AB
Candida Glabrata: NEGATIVE
Candida Vaginitis: NEGATIVE
Chlamydia: NEGATIVE
Comment: NEGATIVE
Comment: NEGATIVE
Comment: NEGATIVE
Comment: NEGATIVE
Comment: NEGATIVE
Comment: NORMAL
Neisseria Gonorrhea: NEGATIVE
Trichomonas: NEGATIVE

## 2024-04-13 ENCOUNTER — Ambulatory Visit: Payer: 59
